# Patient Record
Sex: Female | Born: 1970 | Race: Black or African American | Hispanic: No | Marital: Single | Smoking: Never smoker
Health system: Southern US, Community
[De-identification: ages and names within clinical notes are randomized; demographics above are authoritative.]

## PROBLEM LIST (undated history)

## (undated) DIAGNOSIS — K219 Gastro-esophageal reflux disease without esophagitis: Secondary | ICD-10-CM

## (undated) HISTORY — PX: BREAST ENHANCEMENT SURGERY: SHX7

## (undated) HISTORY — PX: TONSILLECTOMY: SUR1361

## (undated) HISTORY — PX: BREAST LUMPECTOMY: SHX2

## (undated) HISTORY — PX: AUGMENTATION MAMMAPLASTY: SUR837

---

## 2007-08-26 DIAGNOSIS — F411 Generalized anxiety disorder: Secondary | ICD-10-CM | POA: Insufficient documentation

## 2007-08-26 DIAGNOSIS — E559 Vitamin D deficiency, unspecified: Secondary | ICD-10-CM | POA: Insufficient documentation

## 2011-02-14 DIAGNOSIS — K219 Gastro-esophageal reflux disease without esophagitis: Secondary | ICD-10-CM | POA: Insufficient documentation

## 2011-11-06 DIAGNOSIS — M069 Rheumatoid arthritis, unspecified: Secondary | ICD-10-CM | POA: Insufficient documentation

## 2017-11-05 ENCOUNTER — Other Ambulatory Visit: Payer: Self-pay | Admitting: Obstetrics and Gynecology

## 2017-11-05 DIAGNOSIS — Z1231 Encounter for screening mammogram for malignant neoplasm of breast: Secondary | ICD-10-CM

## 2017-11-05 LAB — RESULTS CONSOLE HPV: CHL HPV: NEGATIVE

## 2017-11-05 LAB — OB RESULTS CONSOLE GC/CHLAMYDIA: Chlamydia: NEGATIVE

## 2017-11-05 LAB — HM PAP SMEAR: HM Pap smear: NORMAL

## 2017-11-25 ENCOUNTER — Ambulatory Visit
Admission: RE | Admit: 2017-11-25 | Discharge: 2017-11-25 | Disposition: A | Discharge status: Home / Self Care | Payer: BC Managed Care – PPO | Source: Ambulatory Visit | Attending: Obstetrics and Gynecology | Admitting: Obstetrics and Gynecology

## 2017-11-25 ENCOUNTER — Other Ambulatory Visit: Payer: Self-pay | Admitting: Obstetrics and Gynecology

## 2017-11-25 ENCOUNTER — Encounter: Payer: Self-pay | Admitting: Radiology

## 2017-11-25 DIAGNOSIS — Z1231 Encounter for screening mammogram for malignant neoplasm of breast: Secondary | ICD-10-CM | POA: Diagnosis present

## 2017-12-04 ENCOUNTER — Other Ambulatory Visit: Payer: Self-pay | Admitting: *Deleted

## 2017-12-04 ENCOUNTER — Inpatient Hospital Stay
Admission: RE | Admit: 2017-12-04 | Discharge: 2017-12-04 | Disposition: A | Discharge status: Home / Self Care | Payer: Self-pay | Source: Ambulatory Visit | Attending: *Deleted | Admitting: *Deleted

## 2017-12-04 DIAGNOSIS — Z9289 Personal history of other medical treatment: Secondary | ICD-10-CM

## 2018-09-15 ENCOUNTER — Ambulatory Visit: Payer: Self-pay | Admitting: Family Medicine

## 2018-09-24 ENCOUNTER — Encounter: Payer: Self-pay | Admitting: Family Medicine

## 2018-09-24 ENCOUNTER — Ambulatory Visit: Payer: BC Managed Care – PPO | Admitting: Family Medicine

## 2018-09-24 VITALS — BP 124/78 | HR 100 | Temp 98.2°F | Resp 16 | Ht 62.0 in | Wt 169.7 lb

## 2018-09-24 DIAGNOSIS — Z7689 Persons encountering health services in other specified circumstances: Secondary | ICD-10-CM

## 2018-09-24 DIAGNOSIS — Z6831 Body mass index (BMI) 31.0-31.9, adult: Secondary | ICD-10-CM

## 2018-09-24 DIAGNOSIS — R079 Chest pain, unspecified: Secondary | ICD-10-CM | POA: Diagnosis not present

## 2018-09-24 DIAGNOSIS — K219 Gastro-esophageal reflux disease without esophagitis: Secondary | ICD-10-CM

## 2018-09-24 DIAGNOSIS — Z1159 Encounter for screening for other viral diseases: Secondary | ICD-10-CM

## 2018-09-24 DIAGNOSIS — Z114 Encounter for screening for human immunodeficiency virus [HIV]: Secondary | ICD-10-CM

## 2018-09-24 DIAGNOSIS — Z13220 Encounter for screening for lipoid disorders: Secondary | ICD-10-CM

## 2018-09-24 DIAGNOSIS — Z23 Encounter for immunization: Secondary | ICD-10-CM

## 2018-09-24 DIAGNOSIS — E6609 Other obesity due to excess calories: Secondary | ICD-10-CM

## 2018-09-24 DIAGNOSIS — M069 Rheumatoid arthritis, unspecified: Secondary | ICD-10-CM

## 2018-09-24 MED ORDER — OMEPRAZOLE 20 MG PO CPDR: 20.0000 mg | DELAYED_RELEASE_CAPSULE | Freq: Every day | ORAL | 1 refills | Status: DC

## 2018-09-24 NOTE — Patient Instructions (Signed)

## 2018-09-24 NOTE — Progress Notes (Signed)
Name: Rita Martinez   MRN: [id number]    DOB: 1971/07/09   Date:09/24/2018       Progress Note  Subjective  Chief Complaint  Chief Complaint  Patient presents with  . Establish Care  . Annual Exam    HPI  Pt in to establish care, she has not been seeing a primary care provider in the past, pt also in for evaluation of intermittent chest pain for the last few months, states she went to check her BP during an episode and her BP was elevated. This prompted her to start to establish care.  Chest pain: Pt c/o intermittent chest pain, worse with caffeine intake, pt thinks it is related to her diet. Pain is substernal and she describes this as tightness, sometimes pain is to the left side of her chest. Denies palpitations, denies shortness of breath. Most recent episode of chest pain was last week, states it started after eating BBQ at work. Pt took a goody powder which did improve pain, pain resolved the next day. BP within normal range in office today. Pt has never been treated for HTN in the past. Pt does report intermittent foot and ankle swelling but has not experienced that in the past several months.  Family history - Maternal grandfather has heart disease in his 67's.  She notes history of "stomach issues" many years ago - sulfasalazine seems to help with this now. Denies blood in stool or dark and tarry stool.  RA: followed at [facility], last saw her rheumatologist earlier in the year. Pt currently taking sulfasalazine for this and has been doing well on this. Denies recent flairs, denies joint pain or swelling  There are no active problems to display for this patient.   Past Surgical History:  Procedure Laterality Date  . AUGMENTATION MAMMAPLASTY Bilateral 1990's   saline  . BREAST LUMPECTOMY Left 1990's   benign    Family History  Problem Relation Age of Onset  . Breast cancer Mother 62  . High Cholesterol Mother   . Multiple myeloma Mother   . Prostate cancer Father   . High  Cholesterol Father   . Hypertension Father     Social History   Socioeconomic History  . Marital status: Single    Spouse name: Not on file  . Number of children: 2  . Years of education: Not on file  . Highest education level: Not on file  Occupational History  . Not on file  Social Needs  . Financial resource strain: Not hard at all  . Food insecurity:    Worry: Never true    Inability: Never true  . Transportation needs:    Medical: No    Non-medical: No  Tobacco Use  . Smoking status: Never Smoker  . Smokeless tobacco: Never Used  Substance and Sexual Activity  . Alcohol use: Never    Frequency: Never  . Drug use: Never  . Sexual activity: Yes    Partners: Male    Birth control/protection: Surgical  Lifestyle  . Physical activity:    Days per week: 0 days    Minutes per session: 0 min  . Stress: Not at all  Relationships  . Social connections:    Talks on phone: More than three times a week    Gets together: More than three times a week    Attends religious service: Never    Active member of club or organization: No    Attends meetings of clubs or organizations:  Never    Relationship status: Separated  . Intimate partner violence:    Fear of current or ex partner: No    Emotionally abused: No    Physically abused: No    Forced sexual activity: No  Other Topics Concern  . Not on file  Social History Narrative   Lives with her son Raynelle Jan.      Current Outpatient Medications:  .  sulfaSALAzine (AZULFIDINE) 500 MG tablet, sulfasalazine 500 mg tablet  TAKE 2 TABLETS (1,000 MG TOTAL) BY MOUTH 2 TIMES DAILY., Disp: , Rfl:  .  omeprazole (PRILOSEC) 20 MG capsule, Take 1 capsule (20 mg total) by mouth daily., Disp: 60 capsule, Rfl: 1  No Known Allergies  I personally reviewed active problem list, medication list, allergies with the patient/caregiver today.   ROS Constitutional: Negative for fever or weight change.  Respiratory: Negative for cough  and shortness of breath.   Cardiovascular: See HPI Gastrointestinal: Negative for abdominal pain, no bowel changes.  Musculoskeletal: Negative for gait problem or joint swelling.  Skin: Negative for rash.  Neurological: Negative for dizziness or headache.  No other specific complaints in a complete review of systems (except as listed in HPI above).   Objective  Vitals:   09/24/18 1049  BP: 124/78  Pulse: 100  Resp: 16  Temp: 98.2 F (36.8 C)  TempSrc: Oral  SpO2: 98%  Weight: 169 lb 11.2 oz (77 kg)  Height: _0  (1.575 m)   Body mass index is 31.04 kg/m.  Physical Exam Constitutional: Patient appears well-developed and well-nourished. No distress.  HENT: Head: Normocephalic and atraumatic. Nose: Nose normal. Mouth/Throat: Oropharynx is clear and moist. No oropharyngeal exudate.  Eyes: Conjunctivae and EOM are normal. Pupils are equal, round, and reactive to light. No scleral icterus.  Neck: Normal range of motion. Neck supple. No JVD present. No thyromegaly present.  Cardiovascular: Normal rate, regular rhythm and normal heart sounds.  No murmur heard. No BLE edema. Pulmonary/Chest: Effort normal and breath sounds normal. No respiratory distress. Abdominal: Soft. Bowel sounds are normal, no distension. There is no tenderness. no masses Musculoskeletal: Normal range of motion, no joint effusions. No gross deformities Neurological: he is alert and oriented to person, place, and time. No cranial nerve deficit. Coordination, balance, strength, speech and gait are normal.  Skin: Skin is warm and dry. No rash noted. No erythema.  Psychiatric: Patient has a normal mood and affect. behavior is normal. Judgment and thought content normal.  No results found for this or any previous visit (from the past 72 hour(s)).  PHQ2/9: Depression screen PHQ 2/9 09/24/2018  Decreased Interest 0  Down, Depressed, Hopeless 0  PHQ - 2 Score 0  Altered sleeping 0  Tired, decreased energy 0    Change in appetite 0  Feeling bad or failure about yourself  0  Trouble concentrating 0  Moving slowly or fidgety/restless 0  Suicidal thoughts 0  PHQ-9 Score 0  Difficult doing work/chores Not difficult at all   Fall Risk: Fall Risk  09/24/2018  Falls in the past year? 0  Number falls in past yr: 0  Injury with Fall? 0   Assessment & Plan  1. Chest pain, unspecified type - EKG: normal EKG, normal sinus rhythm. - CBC - COMPLETE METABOLIC PANEL WITH GFR - Lipid Profile - TSH - EKG 12-Lead - omeprazole (PRILOSEC) 20 MG capsule; Take 1 capsule (20 mg total) by mouth daily.  Dispense: 60 capsule; Refill: 1 - Advised likely GERD,  however we will keep close follow up - if not improving with dietary changes and omeprazole, we will refer to cardiology for further evaluation   2. Encounter to establish care  3. Lipid screening - Lipid Profile  4. Need for Tdap vaccination - Tdap vaccine greater than or equal to 7yo IM  5. Class 1 obesity due to excess calories without serious comorbidity with body mass index (BMI) of 31.0 to 31.9 in adult - CBC - COMPLETE METABOLIC PANEL WITH GFR - Lipid Profile - TSH - Discussed importance of 150 minutes of physical activity weekly, eat two servings of fish weekly, eat one serving of tree nuts ( cashews, pistachios, pecans, almonds.Marland Kitchen) every other day, eat 6 servings of fruit/vegetables daily and drink plenty of water and avoid sweet beverages.   6. Need for hepatitis C screening test - Hepatitis C antibody  7. Encounter for screening for HIV - HIV Antibody (routine testing w rflx)  8. Gastro-esophageal reflux disease without esophagitis - Omeprazole per orders; see AVS for teaching  9. Rheumatoid arthritis, involving unspecified site, unspecified rheumatoid factor presence ([place]) - Keep Rheumatology follow up

## 2018-09-25 ENCOUNTER — Other Ambulatory Visit: Payer: Self-pay | Admitting: Emergency Medicine

## 2018-09-25 ENCOUNTER — Other Ambulatory Visit: Payer: Self-pay | Admitting: Family Medicine

## 2018-09-25 DIAGNOSIS — D649 Anemia, unspecified: Secondary | ICD-10-CM

## 2018-09-29 LAB — TSH: TSH: 1.35 mIU/L

## 2018-09-29 LAB — COMPLETE METABOLIC PANEL WITH GFR
AG Ratio: 1.5 (calc) (ref 1.0–2.5)
ALT: 34 U/L — ABNORMAL HIGH (ref 6–29)
AST: 34 U/L (ref 10–35)
Albumin: 4.3 g/dL (ref 3.6–5.1)
Alkaline phosphatase (APISO): 109 U/L (ref 33–115)
BUN: 9 mg/dL (ref 7–25)
CO2: 26 mmol/L (ref 20–32)
Calcium: 9.4 mg/dL (ref 8.6–10.2)
Chloride: 107 mmol/L (ref 98–110)
Creat: 0.96 mg/dL (ref 0.50–1.10)
GFR, Est African American: 82 mL/min/{1.73_m2} (ref >=60)
GFR, Est Non African American: 70 mL/min/{1.73_m2} (ref >=60)
Globulin: 2.8 g/dL (calc) (ref 1.9–3.7)
Glucose, Bld: 75 mg/dL (ref 65–139)
Potassium: 3.9 mmol/L (ref 3.5–5.3)
Sodium: 142 mmol/L (ref 135–146)
Total Bilirubin: 0.5 mg/dL (ref 0.2–1.2)
Total Protein: 7.1 g/dL (ref 6.1–8.1)

## 2018-09-29 LAB — CBC
HCT: 40.3 % (ref 35.0–45.0)
Hemoglobin: 13.1 g/dL (ref 11.7–15.5)
MCH: 25 pg — ABNORMAL LOW (ref 27.0–33.0)
MCHC: 32.5 g/dL (ref 32.0–36.0)
MCV: 76.8 fL — ABNORMAL LOW (ref 80.0–100.0)
MPV: 12 fL (ref 7.5–12.5)
Platelets: 230 10*3/uL (ref 140–400)
RBC: 5.25 10*6/uL — ABNORMAL HIGH (ref 3.80–5.10)
RDW: 14.2 % (ref 11.0–15.0)
WBC: 5.6 10*3/uL (ref 3.8–10.8)

## 2018-09-29 LAB — LIPID PANEL
Cholesterol: 157 mg/dL (ref <200)
HDL: 70 mg/dL (ref >50)
LDL Cholesterol (Calc): 73 mg/dL (calc)
Non-HDL Cholesterol (Calc): 87 mg/dL (calc) (ref <130)
Total CHOL/HDL Ratio: 2.2 (calc) (ref <5.0)
Triglycerides: 59 mg/dL (ref <150)

## 2018-09-29 LAB — HEPATITIS C ANTIBODY
Hepatitis C Ab: NONREACTIVE
SIGNAL TO CUT-OFF: 0.03 (ref <1.00)

## 2018-09-29 LAB — IRON,TIBC AND FERRITIN PANEL
%SAT: 31 % (calc) (ref 16–45)
Ferritin: 52 ng/mL (ref 16–232)
Iron: 107 ug/dL (ref 40–190)
TIBC: 349 mcg/dL (calc) (ref 250–450)

## 2018-09-29 LAB — HIV ANTIBODY (ROUTINE TESTING W REFLEX): HIV 1&2 Ab, 4th Generation: NONREACTIVE

## 2018-10-06 ENCOUNTER — Ambulatory Visit: Payer: BC Managed Care – PPO | Admitting: Family Medicine

## 2018-10-22 ENCOUNTER — Encounter: Payer: Self-pay | Admitting: Family Medicine

## 2018-10-22 ENCOUNTER — Ambulatory Visit (INDEPENDENT_AMBULATORY_CARE_PROVIDER_SITE_OTHER): Payer: BC Managed Care – PPO | Admitting: Family Medicine

## 2018-10-22 VITALS — BP 122/82 | HR 98 | Temp 98.3°F | Resp 16 | Ht 62.0 in | Wt 166.9 lb

## 2018-10-22 DIAGNOSIS — J029 Acute pharyngitis, unspecified: Secondary | ICD-10-CM | POA: Diagnosis not present

## 2018-10-22 DIAGNOSIS — J069 Acute upper respiratory infection, unspecified: Secondary | ICD-10-CM

## 2018-10-22 DIAGNOSIS — L409 Psoriasis, unspecified: Secondary | ICD-10-CM | POA: Insufficient documentation

## 2018-10-22 MED ORDER — FLUTICASONE PROPIONATE 50 MCG/ACT NA SUSP: 2.0000 | Freq: Every day | NASAL | 6 refills | Status: DC

## 2018-10-22 MED ORDER — PREDNISONE 20 MG PO TABS: 20.0000 mg | ORAL_TABLET | Freq: Two times a day (BID) | ORAL | 0 refills | Status: AC

## 2018-10-22 MED ORDER — CLOBETASOL PROPIONATE EMULSION 0.05 % EX FOAM: 1.0000 "application " | Freq: Two times a day (BID) | CUTANEOUS | 0 refills | Status: DC

## 2018-10-22 MED ORDER — MAGIC MOUTHWASH W/LIDOCAINE: 5.0000 mL | Freq: Four times a day (QID) | ORAL | 0 refills | Status: DC | PRN

## 2018-10-22 MED ORDER — TRIAMCINOLONE ACETONIDE 0.1 % EX OINT: 1.0000 "application " | TOPICAL_OINTMENT | Freq: Two times a day (BID) | CUTANEOUS | 0 refills | Status: DC

## 2018-10-22 MED ORDER — BENZONATATE 100 MG PO CAPS: 100.0000 mg | ORAL_CAPSULE | Freq: Three times a day (TID) | ORAL | 0 refills | Status: DC | PRN

## 2018-10-22 NOTE — Progress Notes (Signed)
Name: Rita Martinez   MRN: [id number]    DOB: 12/18/1970   Date:10/22/2018       Progress Note  Subjective  Chief Complaint  Chief Complaint  Patient presents with  . URI    chills, cough, sore throst, headache for 3 days    HPI  PT presents with concern for new onset cough, sore throat, some headache and intermittent chills.  Endorses some frontal sinus congestionb.  No fevers, chest pain, shortness of breath, NVD or abdominal pain.  Rash: She also notes a rash on the scalp that has been present on and off for many years - has cleared with oral prednisone in the past - has never been told she has psoriasis or eczema in the past. The area is itchy but no pain, no exuxdate  Patient Active Problem List   Diagnosis Date Noted  . Chest pain 09/24/2018  . Class 1 obesity due to excess calories without serious comorbidity with body mass index (BMI) of 31.0 to 31.9 in adult 09/24/2018  . Rheumatoid arthritis ([place]) 11/06/2011  . Gastro-esophageal reflux disease without esophagitis 02/14/2011  . Anxiety state 08/26/2007  . Vitamin D deficiency 08/26/2007    Social History   Tobacco Use  . Smoking status: Never Smoker  . Smokeless tobacco: Never Used  Substance Use Topics  . Alcohol use: Never    Frequency: Never     Current Outpatient Medications:  .  omeprazole (PRILOSEC) 20 MG capsule, Take 1 capsule (20 mg total) by mouth daily., Disp: 60 capsule, Rfl: 1 .  sulfaSALAzine (AZULFIDINE) 500 MG tablet, sulfasalazine 500 mg tablet  TAKE 2 TABLETS (1,000 MG TOTAL) BY MOUTH 2 TIMES DAILY., Disp: , Rfl:   No Known Allergies  I personally reviewed active problem list, medication list, allergies with the patient/caregiver today.  ROS  Ten systems reviewed and is negative except as mentioned in HPI  Objective  Vitals:   10/22/18 0942  BP: 122/82  Pulse: 98  Resp: 16  Temp: 98.3 F (36.8 C)  TempSrc: Oral  SpO2: 96%  Weight: 166 lb 14.4 oz (75.7 kg)  Height: 5\' 2"   (1.575 m)   Body mass index is 30.53 kg/m.  Nursing Note and Vital Signs reviewed.  Physical Exam  Constitutional: Patient appears well-developed and well-nourished.  No distress.  HEENT: head atraumatic, normocephalic, pupils equal and reactive to light, Bilateral TM's without erythema or effusion,  bilateral maxillary and frontal sinuses are non-tender, neck supple without lymphadenopathy, OP with erythema but no exudate, no tonsillar swelling. Cardiovascular: Normal rate, regular rhythm and normal heart sounds.  No murmur heard. No BLE edema. Pulmonary/Chest: Effort normal and breath sounds clear bilaterally. No respiratory distress. Abdominal: Soft, bowel sounds normal, there is no tenderness, no HSM Psychiatric: Patient has a normal mood and affect. behavior is normal. Judgment and thought content normal. Skin: Several small plaques that are hyperpigmented and non-erythematous without exudate to scalp and just below hairline are present.  No results found for this or any previous visit (from the past 72 hour(s)).  Assessment & Plan  1. Sore throat - magic mouthwash w/lidocaine SOLN; Take 5 mLs by mouth 4 (four) times daily as needed for mouth pain.  Dispense: 100 mL; Refill: 0 - predniSONE (DELTASONE) 20 MG tablet; Take 1 tablet (20 mg total) by mouth 2 (two) times daily with a meal for 5 days.  Dispense: 10 tablet; Refill: 0  2. Upper respiratory tract infection, unspecified type - benzonatate (TESSALON PERLES) 100  MG capsule; Take 1-2 capsules (100-200 mg total) by mouth 3 (three) times daily as needed.  Dispense: 20 capsule; Refill: 0 - fluticasone (FLONASE) 50 MCG/ACT nasal spray; Place 2 sprays into both nostrils daily.  Dispense: 16 g; Refill: 6 - magic mouthwash w/lidocaine SOLN; Take 5 mLs by mouth 4 (four) times daily as needed for mouth pain.  Dispense: 100 mL; Refill: 0  3. Psoriasis of scalp - triamcinolone ointment (KENALOG) 0.1 %; Apply 1 application topically 2  (two) times daily.  Dispense: 30 g; Refill: 0 - Clobetasol Propionate Emulsion 0.05 % topical foam; Apply 1 application topically 2 (two) times daily. Apply to scalp only, wash hands thoroughly after use.  Dispense: 100 g; Refill: 0  -Red flags and when to present for emergency care or RTC including fever >101.62F, chest pain, shortness of breath, new/worsening/un-resolving symptoms, reviewed with patient at time of visit. Follow up and care instructions discussed and provided in AVS.

## 2018-12-21 ENCOUNTER — Other Ambulatory Visit: Payer: Self-pay | Admitting: Obstetrics and Gynecology

## 2018-12-21 DIAGNOSIS — Z1231 Encounter for screening mammogram for malignant neoplasm of breast: Secondary | ICD-10-CM

## 2019-01-07 ENCOUNTER — Ambulatory Visit
Admission: RE | Admit: 2019-01-07 | Discharge: 2019-01-07 | Disposition: A | Discharge status: Home / Self Care | Payer: BC Managed Care – PPO | Source: Ambulatory Visit | Attending: Obstetrics and Gynecology | Admitting: Obstetrics and Gynecology

## 2019-01-07 DIAGNOSIS — Z1231 Encounter for screening mammogram for malignant neoplasm of breast: Secondary | ICD-10-CM | POA: Insufficient documentation

## 2019-05-07 ENCOUNTER — Telehealth: Payer: BC Managed Care – PPO | Admitting: Family

## 2019-05-07 DIAGNOSIS — M545 Low back pain, unspecified: Secondary | ICD-10-CM

## 2019-05-07 MED ORDER — BACLOFEN 10 MG PO TABS: 10.0000 mg | ORAL_TABLET | Freq: Three times a day (TID) | ORAL | 0 refills | Status: DC | PRN

## 2019-05-07 MED ORDER — ETODOLAC 300 MG PO CAPS: 300.0000 mg | ORAL_CAPSULE | Freq: Two times a day (BID) | ORAL | 0 refills | Status: DC

## 2019-05-07 NOTE — Progress Notes (Signed)
Greater than 5 minutes, yet less than 10 minutes of time have been spent researching, coordinating, and implementing care for this patient today.  Thank you for the details you included in the comment boxes. Those details are very helpful in determining the best course of treatment for you and help [place] to provide the best care.  We are sorry that you are not feeling well.  Here is how we plan to help!  Based on what you have shared with me it looks like you mostly have acute back pain.  Acute back pain is defined as musculoskeletal pain that can resolve in 1-3 weeks with conservative treatment.  I have prescribed Etodolac 300 mg twice a day non-steroid anti-inflammatory (NSAID) as well as Baclofen 10 mg every eight hours as needed which is a muscle relaxer  Some patients experience stomach irritation or in increased heartburn with anti-inflammatory drugs.  Please keep in mind that muscle relaxer's can cause fatigue and should not be taken while at work or driving.  Back pain is very common.  The pain often gets better over time.  The cause of back pain is usually not dangerous.  Most people can learn to manage their back pain on their own.  Home Care  Stay active.  Start with short walks on flat ground if you can.  Try to walk farther each day.  Do not sit, drive or stand in one place for more than 30 minutes.  Do not stay in bed.  Do not avoid exercise or work.  Activity can help your back heal faster.  Be careful when you bend or lift an object.  Bend at your knees, keep the object close to you, and do not twist.  Sleep on a firm mattress.  Lie on your side, and bend your knees.  If you lie on your back, put a pillow under your knees.  Only take medicines as told by your doctor.  Put ice on the injured area.  Put ice in a plastic bag  Place a towel between your skin and the bag  Leave the ice on for 15-20 minutes, 3-4 times a day for the first 2-3 days. 210 After that, you can switch  between ice and heat packs.  Ask your doctor about back exercises or massage.  Avoid feeling anxious or stressed.  Find good ways to deal with stress, such as exercise.  Get Help Right Way If:  Your pain does not go away with rest or medicine.  Your pain does not go away in 1 week.  You have new problems.  You do not feel well.  The pain spreads into your legs.  You cannot control when you poop (bowel movement) or pee (urinate)  You feel sick to your stomach (nauseous) or throw up (vomit)  You have belly (abdominal) pain.  You feel like you may pass out (faint).  If you develop a fever.  Make Sure you:  Understand these instructions.  Will watch your condition  Will get help right away if you are not doing well or get worse.  Your e-visit answers were reviewed by a board certified advanced clinical practitioner to complete your personal care plan.  Depending on the condition, your plan could have included both over the counter or prescription medications.  If there is a problem please reply  once you have received a response from your provider.  Your safety is important to [place].  If you have drug allergies check your prescription  carefully.    You can use MyChart to ask questions about today's visit, request a non-urgent call back, or ask for a work or school excuse for 24 hours related to this e-Visit. If it has been greater than 24 hours you will need to follow up with your provider, or enter a new e-Visit to address those concerns.  You will get an e-mail in the next two days asking about your experience.  I hope that your e-visit has been valuable and will speed your recovery. Thank you for using e-visits.

## 2020-02-02 ENCOUNTER — Encounter: Payer: Self-pay | Admitting: Family Medicine

## 2020-04-27 ENCOUNTER — Other Ambulatory Visit: Payer: Self-pay | Admitting: Obstetrics and Gynecology

## 2020-04-27 DIAGNOSIS — Z1231 Encounter for screening mammogram for malignant neoplasm of breast: Secondary | ICD-10-CM

## 2020-05-09 ENCOUNTER — Ambulatory Visit
Admission: RE | Admit: 2020-05-09 | Discharge: 2020-05-09 | Disposition: A | Discharge status: Home / Self Care | Payer: BC Managed Care – PPO | Source: Ambulatory Visit | Attending: Obstetrics and Gynecology | Admitting: Obstetrics and Gynecology

## 2020-05-09 DIAGNOSIS — Z1231 Encounter for screening mammogram for malignant neoplasm of breast: Secondary | ICD-10-CM | POA: Diagnosis not present

## 2020-07-16 ENCOUNTER — Telehealth: Payer: BC Managed Care – PPO | Admitting: Emergency Medicine

## 2020-07-16 DIAGNOSIS — S90862A Insect bite (nonvenomous), left foot, initial encounter: Secondary | ICD-10-CM | POA: Diagnosis not present

## 2020-07-16 DIAGNOSIS — W57XXXA Bitten or stung by nonvenomous insect and other nonvenomous arthropods, initial encounter: Secondary | ICD-10-CM

## 2020-07-16 NOTE — Progress Notes (Signed)
Time spent: 10 min  Rita Martinez,  Thank you for describing the insect sting for Rita Martinez.  Here is how we plan to help!  An uncomplicated insect sting that just occurred and can be closely followed using the instructions in your care plan.  Take ibuprofen and/or acetaminophen every 6-8 hours for pain, swelling.  Elevate your foot.  Apply ice and wrap with a compressive bandage to help with swelling.  Continue taking 25-50 mg diphenhydramine (benadryl) every 6 hours for itching. Symptoms should improve in 2-3 days.   Seek immediately help if there is any facial, lip, throat or tongue swelling, chest pain or shortness of breath or if you develop symptoms of an infection like sting area redness, warmth, pus, fever.   The 2 greatest risks from insect stings are allergic reaction, which can be fatal in some people and infection, which is more common and less serious.  Bees, wasps, yellow jackets, and hornets belong to a class of insects called Hymenoptera.  Most insect stings cause only minor discomfort.  Stings can happen anywhere on the body and can be painful.  Most stings are from honey bees or yellow jackets.  Fire ants can sting multiple times.  The sites of the stings are more likely to become infected.    Provided a home care guide for insect stings and instructions on when to call for help.  What can be used to prevent Insect Stings?   Insect repellant with at least 20% DEET.    Wearing long pants and shirts with socks and shoes.    Wear dark or drab-colored clothes rather than bright colors.    Avoid using perfumes and hair sprays; these attract insects.  HOME CARE ADVICE:  1. Stinger removal:  The stinger looks like a tiny black dot in the sting.  Use a fingernail, credit card edge, or knife-edge to scrape it off.  Don't pull it out because it squeezes out more venom.  If the stinger is below the skin surface, leave it alone.  It will be shed with normal skin healing. 2. Use  cold compresses to the area of the sting for 10-20 minutes.  You may repeat this as needed to relieve symptoms of pain and swelling. 3.  For pain relief, take acetominophen 650 mg 4-6 hours as needed or ibuprofen 400 mg every 6-8 hours as needed or naproxen 250-500 mg every 12 hours as needed. 4.  You can also use hydrocortisone cream 0.5% or 1% up to 4 times daily as needed for itching. 5.  If the sting becomes very itchy, take Benadryl 25-50 mg, follow directions on box. 6.  Wash the area 2-3 times daily with antibacterial soap and warm water. 7. Call your Doctor if:  Fever, a severe headache, or rash occur in the next 2 weeks.  Sting area begins to look infected.  Redness and swelling worsens after home treatment.  Your current symptoms become worse.    MAKE SURE YOU:   Understand these instructions.  Will watch your condition.  Will get help right away if you are not doing well or get worse.  Thank you for choosing an e-visit. Your e-visit answers were reviewed by a board certified advanced clinical practitioner to complete your personal care plan. Depending upon the condition, your plan could have included both over the counter or prescription medications. Please review your pharmacy choice. Be sure that the pharmacy you have chosen is open so that you can pick up your prescription now.  If there is a problem you may message your provider in [place] to have the prescription routed to another pharmacy. Your safety is important to Rita Martinez. If you have drug allergies check your prescription carefully.  For the next 24 hours, you can use MyChart to ask questions about today's visit, request a non-urgent call back, or ask for a work or school excuse from your e-visit provider. You will get an email in the next two days asking about your experience. I hope that your e-visit has been valuable and will speed your recovery.

## 2020-11-09 ENCOUNTER — Encounter: Payer: BC Managed Care – PPO | Admitting: Family Medicine

## 2020-11-14 ENCOUNTER — Other Ambulatory Visit: Payer: Self-pay

## 2020-11-14 ENCOUNTER — Emergency Department: Payer: BC Managed Care – PPO

## 2020-11-14 ENCOUNTER — Emergency Department
Admission: EM | Admit: 2020-11-14 | Discharge: 2020-11-14 | Disposition: A | Discharge status: Home / Self Care | Payer: BC Managed Care – PPO | Attending: Emergency Medicine | Admitting: Emergency Medicine

## 2020-11-14 ENCOUNTER — Encounter: Payer: Self-pay | Admitting: Emergency Medicine

## 2020-11-14 DIAGNOSIS — R110 Nausea: Secondary | ICD-10-CM | POA: Diagnosis not present

## 2020-11-14 DIAGNOSIS — R0789 Other chest pain: Secondary | ICD-10-CM | POA: Diagnosis not present

## 2020-11-14 DIAGNOSIS — R079 Chest pain, unspecified: Secondary | ICD-10-CM

## 2020-11-14 LAB — COMPREHENSIVE METABOLIC PANEL
ALT: 34 U/L (ref 0–44)
AST: 32 U/L (ref 15–41)
Albumin: 3.9 g/dL (ref 3.5–5.0)
Alkaline Phosphatase: 89 U/L (ref 38–126)
Anion gap: 10 (ref 5–15)
BUN: 10 mg/dL (ref 6–20)
CO2: 27 mmol/L (ref 22–32)
Calcium: 9.4 mg/dL (ref 8.9–10.3)
Chloride: 103 mmol/L (ref 98–111)
Creatinine, Ser: 0.83 mg/dL (ref 0.44–1.00)
GFR, Estimated: >60 mL/min (ref >60)
Glucose, Bld: 95 mg/dL (ref 70–99)
Potassium: 3.6 mmol/L (ref 3.5–5.1)
Sodium: 140 mmol/L (ref 135–145)
Total Bilirubin: 0.6 mg/dL (ref 0.3–1.2)
Total Protein: 7.2 g/dL (ref 6.5–8.1)

## 2020-11-14 LAB — CBC WITH DIFFERENTIAL/PLATELET
Abs Immature Granulocytes: 0.02 10*3/uL (ref 0.00–0.07)
Basophils Absolute: 0 10*3/uL (ref 0.0–0.1)
Basophils Relative: 0 %
Eosinophils Absolute: 0 10*3/uL (ref 0.0–0.5)
Eosinophils Relative: 0 %
HCT: 38.2 % (ref 36.0–46.0)
Hemoglobin: 12.3 g/dL (ref 12.0–15.0)
Immature Granulocytes: 0 %
Lymphocytes Relative: 38 %
Lymphs Abs: 2.5 10*3/uL (ref 0.7–4.0)
MCH: 24.5 pg — ABNORMAL LOW (ref 26.0–34.0)
MCHC: 32.2 g/dL (ref 30.0–36.0)
MCV: 75.9 fL — ABNORMAL LOW (ref 80.0–100.0)
Monocytes Absolute: 0.5 10*3/uL (ref 0.1–1.0)
Monocytes Relative: 7 %
Neutro Abs: 3.7 10*3/uL (ref 1.7–7.7)
Neutrophils Relative %: 55 %
Platelets: 200 10*3/uL (ref 150–400)
RBC: 5.03 MIL/uL (ref 3.87–5.11)
RDW: 14.4 % (ref 11.5–15.5)
WBC: 6.7 10*3/uL (ref 4.0–10.5)
nRBC: 0 % (ref 0.0–0.2)

## 2020-11-14 LAB — TROPONIN I (HIGH SENSITIVITY)
Troponin I (High Sensitivity): <2 ng/L (ref <18)
Troponin I (High Sensitivity): <2 ng/L (ref <18)

## 2020-11-14 MED ORDER — OMEPRAZOLE 20 MG PO CPDR: 20.0000 mg | DELAYED_RELEASE_CAPSULE | Freq: Every day | ORAL | 0 refills | Status: DC

## 2020-11-14 NOTE — ED Provider Notes (Signed)
[facility] Emergency Department Provider Note  ____________________________________________   Event Date/Time   First MD Initiated Contact with Patient 11/14/20 [phone]     (approximate)  I have reviewed the triage vital signs and the nursing notes.   HISTORY  Chief Complaint Chest Pain   HPI Rita Martinez is a 50 y.o. female with a past medical history of psoriasis, RA currently on sulfasalazine, vitamin D deficiency, anxiety, and GERD who presents for assessment of some substernal chest tightness she experienced earlier this morning.  It was accompanied by some nausea but no vomiting.  Patient states she currently feels much better than when it began.  She denies any other acute symptoms including fevers, chills, cough, shortness of breath, abdominal pain, back pain, headache, earache, sore throat, diarrhea, dysuria, rash, extremity pain or any other acute sick symptoms.  She notes she has been clearing her throat more often than usual the last several weeks.  She denies tobacco abuse or significant NSAID use.  Denies any regular EtOH use.  No history of DVT/PE.  No recent surgeries in the past couple months.  No recent mobilization.         History reviewed. No pertinent past medical history.  Patient Active Problem List   Diagnosis Date Noted  . 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 Chest pain 09/24/2018  . Class 1 obesity due to excess calories without serious comorbidity with body mass index (BMI) of 31.0 to 31.9 in adult 09/24/2018  . Rheumatoid arthritis ([place]) 11/06/2011  . Gastro-esophageal reflux disease without esophagitis 02/14/2011  . Anxiety state 08/26/2007  . Vitamin D deficiency 08/26/2007    Past Surgical History:  Procedure Laterality Date  . AUGMENTATION MAMMAPLASTY Bilateral 1990's   saline  . BREAST LUMPECTOMY Left 1990's   benign    Prior to Admission medications   Medication Sig Start Date End Date Taking? Authorizing Provider   baclofen (LIORESAL) 10 MG tablet Take 1 tablet (10 mg total) by mouth every 8 (eight) hours as needed for muscle spasms. 05/07/19   Withrow, Elyse Jarvis, FNP  benzonatate (TESSALON PERLES) 100 MG capsule Take 1-2 capsules (100-200 mg total) by mouth 3 (three) times daily as needed. 10/22/18   Hubbard Hartshorn, FNP  Clobetasol Propionate Emulsion 0.05 % topical foam Apply 1 application topically 2 (two) times daily. Apply to scalp only, wash hands thoroughly after use. 10/22/18   Hubbard Hartshorn, FNP  etodolac (LODINE) 300 MG capsule Take 1 capsule (300 mg total) by mouth 2 (two) times daily. 05/07/19   Withrow, Elyse Jarvis, FNP  fluticasone (FLONASE) 50 MCG/ACT nasal spray Place 2 sprays into both nostrils daily. 10/22/18   Hubbard Hartshorn, FNP  magic mouthwash w/lidocaine SOLN Take 5 mLs by mouth 4 (four) times daily as needed for mouth pain. 10/22/18   Hubbard Hartshorn, FNP  omeprazole (PRILOSEC) 20 MG capsule Take 1 capsule (20 mg total) by mouth daily. 11/14/20 12/14/20  Lucrezia Starch, MD  sulfaSALAzine (AZULFIDINE) 500 MG tablet sulfasalazine 500 mg tablet  TAKE 2 TABLETS (1,000 MG TOTAL) BY MOUTH 2 TIMES DAILY. 05/01/16   [provider]  triamcinolone ointment (KENALOG) 0.1 % Apply 1 application topically 2 (two) times daily. 10/22/18   Hubbard Hartshorn, FNP    Allergies Patient has no known allergies.  Family History  Problem Relation Age of Onset  . Breast cancer Mother 26  . High Cholesterol Mother   . Multiple myeloma Mother   . Prostate  cancer Father   . High Cholesterol Father   . Hypertension Father     Social History Social History   Tobacco Use  . Smoking status: Never Smoker  . Smokeless tobacco: Never Used  Vaping Use  . Vaping Use: Never used  Substance Use Topics  . Alcohol use: Never  . Drug use: Never    Review of Systems  Review of Systems  Constitutional: Negative for chills and fever.  HENT: Negative for sore throat.   Eyes: Negative for pain.  Respiratory:  Negative for cough and stridor.   Cardiovascular: Positive for chest pain.  Gastrointestinal: Negative for vomiting.  Genitourinary: Negative for dysuria.  Musculoskeletal: Negative for myalgias.  Skin: Negative for rash.  Neurological: Negative for seizures, loss of consciousness and headaches.  Psychiatric/Behavioral: Negative for suicidal ideas.  All other systems reviewed and are negative.     ____________________________________________   PHYSICAL EXAM:  VITAL SIGNS: ED Triage Vitals  Enc Vitals Group     BP 11/14/20 0347 128/79     Pulse Rate 11/14/20 0347 69     Resp 11/14/20 0347 18     Temp 11/14/20 0347 98.2 F (36.8 C)     Temp Source 11/14/20 0347 Oral     SpO2 11/14/20 0347 98 %     Weight 11/14/20 0346 165 lb (74.8 kg)     Height 11/14/20 0346 5' 2" (1.575 m)     Head Circumference --      Peak Flow --      Pain Score 11/14/20 0346 4     Pain Loc --      Pain Edu? --      Excl. in [place]? --    Vitals:   11/14/20 0347 11/14/20 0653  BP: 128/79 126/81  Pulse: 69 (!) 59  Resp: 18 20  Temp: 98.2 F (36.8 C) 98 F (36.7 C)  SpO2: 98% 100%   Physical Exam Vitals and nursing note reviewed.  Constitutional:      General: She is not in acute distress.    Appearance: She is well-developed and well-nourished.  HENT:     Head: Normocephalic and atraumatic.     Right Ear: External ear normal.     Left Ear: External ear normal.     Nose: Nose normal.     Mouth/Throat:     Mouth: Mucous membranes are moist.  Eyes:     Conjunctiva/sclera: Conjunctivae normal.  Cardiovascular:     Rate and Rhythm: Normal rate and regular rhythm.     Heart sounds: No murmur heard.   Pulmonary:     Effort: Pulmonary effort is normal. No respiratory distress.     Breath sounds: Normal breath sounds.  Abdominal:     Palpations: Abdomen is soft.     Tenderness: There is no abdominal tenderness.  Musculoskeletal:        General: No edema.     Cervical back: Neck supple.      Right lower leg: No edema.     Left lower leg: No edema.  Skin:    General: Skin is warm and dry.     Capillary Refill: Capillary refill takes less than 2 seconds.  Neurological:     Mental Status: She is alert and oriented to person, place, and time.  Psychiatric:        Mood and Affect: Mood and affect and mood normal.      ____________________________________________   LABS (all labs ordered are listed, but  only abnormal results are displayed)  Labs Reviewed  CBC WITH DIFFERENTIAL/PLATELET - Abnormal; Notable for the following components:      Result Value   MCV 75.9 (*)    MCH 24.5 (*)    All other components within normal limits  COMPREHENSIVE METABOLIC PANEL  TROPONIN I (HIGH SENSITIVITY)  TROPONIN I (HIGH SENSITIVITY)   ____________________________________________  EKG  Sinus rhythm with ventricular rate of 68, normal axis, unremarkable intervals, and some artifact in V1 and V2 without any clear evidence of acute ischemia or other significant Arrhythmia.  ____________________________________________  RADIOLOGY  ED MD interpretation: No pneumothorax, focal consolidation, effusion, edema, early acute intrathoracic process.   Official radiology report(s): DG Chest 2 View  Result Date: 11/14/2020 CLINICAL DATA:  Chest pain EXAM: CHEST - 2 VIEW COMPARISON:  None. FINDINGS: No consolidation, features of edema, pneumothorax, or effusion. Pulmonary vascularity is normally distributed. The cardiomediastinal contours are unremarkable. No acute osseous or soft tissue abnormality. IMPRESSION: No acute cardiopulmonary abnormality. Electronically Signed   By: Lovena Le M.D.   On: 11/14/2020 04:01    ____________________________________________   PROCEDURES  Procedure(s) performed (including Critical Care):  Procedures   ____________________________________________   INITIAL IMPRESSION / ASSESSMENT AND PLAN / ED COURSE      Patient presents with above-stated  history exam for assessment of some chest tightness associate with nausea she experienced earlier this morning.  She is afebrile and hemodynamically stable on arrival  Primary differential includes but is not limited to ACS, PE, pneumonia, pneumothorax, dissection, GERD/esophagitis, esophageal spasm and chest wall etiologies.  CBC is unremarkable.  CMP shows no significant electrolyte or metabolic derangements.  No evidence of cholestasis or hepatitis.  Troponins x2 are unremarkable.  Overall patient's history, exam, ECG and chest x-ray are not consistent with pneumonia, pneumothorax or dissection.  Low suspicion for PE as patient is PERC negative.  Low suspicion for ACS given reassuring EKG and 2 nonelevated troponins.  Presentation and work-up is not consistent with myocarditis or pericarditis.  Concern for possible esophageal spasm versus reflux.  Patient has not been taking her omeprazole and her history of clearing her throat is consistent with some reflux seems to be subacute to chronic.  I did refill her omeprazole prescription.  Given otherwise stable vital signs and reassuring work-up I do she is safe for discharge with plan for outpatient follow-up.  Discharged stable condition.  Strict turn precautions advised and discussed.         ____________________________________________   FINAL CLINICAL IMPRESSION(S) / ED DIAGNOSES  Final diagnoses:  Chest pain, unspecified type    Medications - No data to display   ED Discharge Orders         Ordered    omeprazole (PRILOSEC) 20 MG capsule  Daily        11/14/20 0752           Note:  This document was prepared using Dragon voice recognition software and may include unintentional dictation errors.   Lucrezia Starch, MD 11/14/20 [phone]

## 2020-11-14 NOTE — ED Triage Notes (Signed)
Patient ambulatory to triage with steady gait, without difficulty or distress noted; pt reports awoke PTA with mid CP radiating into neck accomp by nausea; denies hx of same

## 2020-11-23 ENCOUNTER — Encounter: Payer: BC Managed Care – PPO | Admitting: Family Medicine

## 2020-12-10 ENCOUNTER — Other Ambulatory Visit: Payer: Self-pay | Admitting: Family Medicine

## 2020-12-10 DIAGNOSIS — R079 Chest pain, unspecified: Secondary | ICD-10-CM

## 2020-12-19 ENCOUNTER — Encounter: Payer: Self-pay | Admitting: Family Medicine

## 2021-01-01 ENCOUNTER — Ambulatory Visit (INDEPENDENT_AMBULATORY_CARE_PROVIDER_SITE_OTHER): Payer: BC Managed Care – PPO | Admitting: Family Medicine

## 2021-01-01 ENCOUNTER — Encounter: Payer: Self-pay | Admitting: Family Medicine

## 2021-01-01 ENCOUNTER — Other Ambulatory Visit: Payer: Self-pay

## 2021-01-01 VITALS — BP 122/74 | HR 100 | Temp 98.3°F | Resp 16 | Ht 62.0 in | Wt 168.0 lb

## 2021-01-01 DIAGNOSIS — E66811 Obesity, class 1: Secondary | ICD-10-CM

## 2021-01-01 DIAGNOSIS — Z0000 Encounter for general adult medical examination without abnormal findings: Secondary | ICD-10-CM

## 2021-01-01 DIAGNOSIS — K219 Gastro-esophageal reflux disease without esophagitis: Secondary | ICD-10-CM

## 2021-01-01 DIAGNOSIS — N951 Menopausal and female climacteric states: Secondary | ICD-10-CM | POA: Diagnosis not present

## 2021-01-01 DIAGNOSIS — N63 Unspecified lump in unspecified breast: Secondary | ICD-10-CM

## 2021-01-01 DIAGNOSIS — M069 Rheumatoid arthritis, unspecified: Secondary | ICD-10-CM

## 2021-01-01 DIAGNOSIS — L409 Psoriasis, unspecified: Secondary | ICD-10-CM | POA: Diagnosis not present

## 2021-01-01 DIAGNOSIS — Z131 Encounter for screening for diabetes mellitus: Secondary | ICD-10-CM

## 2021-01-01 DIAGNOSIS — E669 Obesity, unspecified: Secondary | ICD-10-CM

## 2021-01-01 DIAGNOSIS — Z1211 Encounter for screening for malignant neoplasm of colon: Secondary | ICD-10-CM | POA: Diagnosis not present

## 2021-01-01 DIAGNOSIS — Z13220 Encounter for screening for lipoid disorders: Secondary | ICD-10-CM

## 2021-01-01 DIAGNOSIS — Z8349 Family history of other endocrine, nutritional and metabolic diseases: Secondary | ICD-10-CM

## 2021-01-01 DIAGNOSIS — Z809 Family history of malignant neoplasm, unspecified: Secondary | ICD-10-CM

## 2021-01-01 MED ORDER — TRIAMCINOLONE ACETONIDE 0.1 % EX OINT: 1.0000 "application " | TOPICAL_OINTMENT | Freq: Two times a day (BID) | CUTANEOUS | 0 refills | Status: DC

## 2021-01-01 MED ORDER — ESOMEPRAZOLE MAGNESIUM 40 MG PO CPDR: 40.0000 mg | DELAYED_RELEASE_CAPSULE | Freq: Every day | ORAL | 1 refills | Status: DC

## 2021-01-01 MED ORDER — CLOBETASOL PROPIONATE EMULSION 0.05 % EX FOAM: 1.0000 "application " | Freq: Two times a day (BID) | CUTANEOUS | 0 refills | Status: DC

## 2021-01-01 NOTE — Progress Notes (Addendum)
Name: Rita Martinez   MRN: [id number]    DOB: 07/19/1971   Date:01/01/2021       Progress Note  Subjective  Chief Complaint  Annual Exam   HPI  Patient presents for annual CPE, discussed may have extra charge for split  visit, but she agreed   RA: she lost to follow up with her Rheumatologist because of location. She is still compliant with medication. She still has morning stiffness takes about 1 hour to feels better. No redness or increase in warmth . She has mild aching pain but not constant  Menopausal symptoms: she was on estrogen and paxil, but last year dose of paxil was increased from 10 mg to 20 mg and vasomotor symptoms has improved . She is also under stress, her mother was diagnosed with Multiple Myeloma and will start treatment soon. She is feeling stressed but able to cope  Scalp rash: plaque on nuchal area, improves with topical steroids and needs a refill  GERD: long history, went to [facility] because she woke up with chest pain, labs reviewed , she was sent home on Omeprazole but did not work, she is now on Nexium otc and seems to help, no heartburn or indigestion, occasionally has chest discomfort but not associated with diaphoresis or sob  Obesity: BMI has been stable over the past two years, discussed importance of healthy eating and exercise   Exercise: needs to increase to 150 minutes per week .  Flowsheet Row Office Visit from 01/01/2021 in [facility]  AUDIT-C Score 0     Depression: Phq 9 is  negative Depression screen PHQ 2/9 01/01/2021 10/22/2018 09/24/2018  Decreased Interest 0 0 0  Down, Depressed, Hopeless 0 0 0  PHQ - 2 Score 0 0 0  Altered sleeping 0 0 0  Tired, decreased energy 0 0 0  Change in appetite 0 0 0  Feeling bad or failure about yourself  0 0 0  Trouble concentrating 0 0 0  Moving slowly or fidgety/restless 0 0 0  Suicidal thoughts 0 0 0  PHQ-9 Score 0 0 0  Difficult doing work/chores - Not difficult at all Not difficult at  all   Hypertension: BP Readings from Last 3 Encounters:  01/01/21 122/74  11/14/20 135/87  10/22/18 122/82   Obesity: Wt Readings from Last 3 Encounters:  01/01/21 168 lb (76.2 kg)  11/14/20 165 lb (74.8 kg)  10/22/18 166 lb 14.4 oz (75.7 kg)   BMI Readings from Last 3 Encounters:  01/01/21 30.73 kg/m  11/14/20 30.18 kg/m  10/22/18 30.53 kg/m     Vaccines:   Pneumonia: she is due for it, but just received Moderma and we will hold off  Flu: up to date   Hep C Screening: 09/24/2018 STD testing and prevention (HIV/chl/gon/syphilis): 09/24/2018 Intimate partner violence:negative screen  Sexual History : same sexual partner for the past few years  Menstrual History/LMP/Abnormal Bleeding: s/p hysterectomy  Incontinence Symptoms: no problems  Breast cancer:  - Last Mammogram: 05/2020 - BRCA gene screening: mother had breast cancer in her 60's, maternal grandmother history of colon cancer in her 60's - she would like to talk to geneticist about it   Osteoporosis: Discussed high calcium and vitamin D supplementation, weight bearing exercises  Cervical cancer screening: she has follow up with gyn in the Summer for repeat pap smear   Skin cancer: Discussed monitoring for atypical lesions  Colorectal cancer: Ordered at today's visit   Lung cancer:   Low Dose   CT Chest recommended if Age 50-80 years, 20 pack-year currently smoking OR have quit w/in 15years. Patient does not qualify.   ECG: 11/14/2020  Advanced Care Planning: A voluntary discussion about advance care planning including the explanation and discussion of advance directives.  Discussed health care proxy and Living will, and the patient was able to identify a health care proxy as daughter - Tylyn    Lipids: Lab Results  Component Value Date   CHOL 157 09/24/2018   Lab Results  Component Value Date   HDL 70 09/24/2018   Lab Results  Component Value Date   LDLCALC 73 09/24/2018   Lab Results  Component  Value Date   TRIG 59 09/24/2018   Lab Results  Component Value Date   CHOLHDL 2.2 09/24/2018   No results found for: LDLDIRECT  Glucose: Glucose, Bld  Date Value Ref Range Status  11/14/2020 95 70 - 99 mg/dL Final    Comment:    Glucose reference range applies only to samples taken after fasting for at least 8 hours.  09/24/2018 75 65 - 139 mg/dL Final    Comment:    .        Non-fasting reference interval .     Patient Active Problem List   Diagnosis Date Noted  . 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 Chest pain 09/24/2018  . Class 1 obesity due to excess calories without serious comorbidity with body mass index (BMI) of 31.0 to 31.9 in adult 09/24/2018  . Rheumatoid arthritis (HCC) 11/06/2011  . Gastro-esophageal reflux disease without esophagitis 02/14/2011  . Anxiety state 08/26/2007  . Vitamin D deficiency 08/26/2007    Past Surgical History:  Procedure Laterality Date  . AUGMENTATION MAMMAPLASTY Bilateral 1990's   saline  . BREAST LUMPECTOMY Left 1990's   benign    Family History  Problem Relation Age of Onset  . Breast cancer Mother 65  . High Cholesterol Mother   . Multiple myeloma Mother   . Prostate cancer Father   . High Cholesterol Father   . Hypertension Father     Social History   Socioeconomic History  . Marital status: Single    Spouse name: Not on file  . Number of children: 2  . Years of education: Not on file  . Highest education level: Not on file  Occupational History  . Not on file  Tobacco Use  . Smoking status: Never Smoker  . Smokeless tobacco: Never Used  Vaping Use  . Vaping Use: Never used  Substance and Sexual Activity  . Alcohol use: Never  . Drug use: Never  . Sexual activity: Yes    Partners: Male    Birth control/protection: Surgical  Other Topics Concern  . Not on file  Social History Narrative   Lives with her son 21yo - Marlon.    Social Determinants of Health   Financial Resource Strain: Low Risk   .  Difficulty of Paying Living Expenses: Not hard at all  Food Insecurity: No Food Insecurity  . Worried About Running Out of Food in the Last Year: Never true  . Ran Out of Food in the Last Year: Never true  Transportation Needs: No Transportation Needs  . Lack of Transportation (Medical): No  . Lack of Transportation (Non-Medical): No  Physical Activity: Inactive  . Days of Exercise per Week: 0 days  . Minutes of Exercise per Session: 0 min  Stress: No Stress Concern Present  . Feeling of Stress : Not at all    Social Connections: Socially Isolated  . Frequency of Communication with Friends and Family: More than three times a week  . Frequency of Social Gatherings with Friends and Family: Once a week  . Attends Religious Services: Never  . Active Member of Clubs or Organizations: No  . Attends Club or Organization Meetings: Never  . Marital Status: Divorced  Intimate Partner Violence: Not At Risk  . Fear of Current or Ex-Partner: No  . Emotionally Abused: No  . Physically Abused: No  . Sexually Abused: No     Current Outpatient Medications:  .  esomeprazole (NEXIUM) 40 MG capsule, Take 1 capsule (40 mg total) by mouth daily., Disp: 90 capsule, Rfl: 1 .  PARoxetine (PAXIL) 20 MG tablet, Take 20 mg by mouth daily., Disp: , Rfl:  .  sulfaSALAzine (AZULFIDINE) 500 MG tablet, Take 1 tablet by mouth daily., Disp: , Rfl:  .  Clobetasol Propionate Emulsion 0.05 % topical foam, Apply 1 application topically 2 (two) times daily. Apply to scalp only, wash hands thoroughly after use., Disp: 100 g, Rfl: 0 .  triamcinolone ointment (KENALOG) 0.1 %, Apply 1 application topically 2 (two) times daily., Disp: 80 g, Rfl: 0  No Known Allergies   ROS  Constitutional: Negative for fever or weight change.  Respiratory: Negative for cough and shortness of breath.   Cardiovascular: Negative for chest pain or palpitations.  Gastrointestinal: Negative for abdominal pain, no bowel changes.   Musculoskeletal: Negative for gait problem or joint swelling.  Skin: Negative for rash.  Neurological: Negative for dizziness or headache.  No other specific complaints in a complete review of systems (except as listed in HPI above).  Objective  Vitals:   01/01/21 1611  BP: 122/74  Pulse: 100  Resp: 16  Temp: 98.3 F (36.8 C)  TempSrc: Oral  SpO2: 99%  Weight: 168 lb (76.2 kg)  Height: 5' 2" (1.575 m)    Body mass index is 30.73 kg/m.  Physical Exam  Constitutional: Patient appears well-developed and well-nourished. Obesity . No distress.  HENT: Head: Normocephalic and atraumatic. Ears: B TMs ok, no erythema or effusion; Nose: not done Mouth/Throat: not done  Eyes: Conjunctivae and EOM are normal. Pupils are equal, round, and reactive to light. No scleral icterus.  Neck: Normal range of motion. Neck supple. No JVD present. No thyromegaly present.  Cardiovascular: Normal rate, regular rhythm and normal heart sounds.  No murmur heard. No BLE edema. Pulmonary/Chest: Effort normal and breath sounds normal. No respiratory distress. Abdominal: Soft. Bowel sounds are normal, no distension. There is no tenderness. no masses Breast: scar from breast augmentation, scar from previous biopsy on left upper outer quadrant , also has round mass above both nipples and on left upper inner quadrant.  no nipple discharge or rashes FEMALE GENITALIA:  Not done  RECTAL: not done  Musculoskeletal: Normal range of motion, no joint effusions. No gross deformities Neurological: he is alert and oriented to person, place, and time. No cranial nerve deficit. Coordination, balance, strength, speech and gait are normal.  Skin: Skin is warm and dry. No rash noted. No erythema. round soft cyst like structure under scalp in three different areas, non tender, not attached to deeper tissue. Also has a dry patch, quarter size on right nuchal area. Scaly  Psychiatric: Patient has a normal mood and affect.  behavior is normal. Judgment and thought content normal.  Recent Results (from the past 2160 hour(s))  CBC with Differential     Status: Abnormal     Collection Time: 11/14/20  3:50 AM  Result Value Ref Range   WBC 6.7 4.0 - 10.5 K/uL   RBC 5.03 3.87 - 5.11 MIL/uL   Hemoglobin 12.3 12.0 - 15.0 g/dL   HCT 38.2 36.0 - 46.0 %   MCV 75.9 (L) 80.0 - 100.0 fL   MCH 24.5 (L) 26.0 - 34.0 pg   MCHC 32.2 30.0 - 36.0 g/dL   RDW 14.4 11.5 - 15.5 %   Platelets 200 150 - 400 K/uL   nRBC 0.0 0.0 - 0.2 %   Neutrophils Relative % 55 %   Neutro Abs 3.7 1.7 - 7.7 K/uL   Lymphocytes Relative 38 %   Lymphs Abs 2.5 0.7 - 4.0 K/uL   Monocytes Relative 7 %   Monocytes Absolute 0.5 0.1 - 1.0 K/uL   Eosinophils Relative 0 %   Eosinophils Absolute 0.0 0.0 - 0.5 K/uL   Basophils Relative 0 %   Basophils Absolute 0.0 0.0 - 0.1 K/uL   Immature Granulocytes 0 %   Abs Immature Granulocytes 0.02 0.00 - 0.07 K/uL    Comment: Performed at [facility], [address] [place], [place] [address]  Comprehensive metabolic panel     Status: None   Collection Time: 11/14/20  3:50 AM  Result Value Ref Range   Sodium 140 135 - 145 mmol/L   Potassium 3.6 3.5 - 5.1 mmol/L   Chloride 103 98 - 111 mmol/L   CO2 27 22 - 32 mmol/L   Glucose, Bld 95 70 - 99 mg/dL    Comment: Glucose reference range applies only to samples taken after fasting for at least 8 hours.   BUN 10 6 - 20 mg/dL   Creatinine, Ser 0.83 0.44 - 1.00 mg/dL   Calcium 9.4 8.9 - 10.3 mg/dL   Total Protein 7.2 6.5 - 8.1 g/dL   Albumin 3.9 3.5 - 5.0 g/dL   AST 32 15 - 41 U/L   ALT 34 0 - 44 U/L   Alkaline Phosphatase 89 38 - 126 U/L   Total Bilirubin 0.6 0.3 - 1.2 mg/dL   GFR, Estimated >60 >60 mL/min    Comment: (NOTE) Calculated using the CKD-EPI Creatinine Equation (2021)    Anion gap 10 5 - 15    Comment: Performed at [facility], [address] [place], [place] [address]  Troponin I (High Sensitivity)     Status: None    Collection Time: 11/14/20  3:50 AM  Result Value Ref Range   Troponin I (High Sensitivity) <2 <18 ng/L    Comment: (NOTE) Elevated high sensitivity troponin I (hsTnI) values and significant  changes across serial measurements may suggest ACS but many other  chronic and acute conditions are known to elevate hsTnI results.  Refer to the Links section for chest pain algorithms and additional  guidance. Performed at [facility], [address] [place], [place] [address]   Troponin I (High Sensitivity)     Status: None   Collection Time: 11/14/20  6:34 AM  Result Value Ref Range   Troponin I (High Sensitivity) <2 <18 ng/L    Comment: (NOTE) Elevated high sensitivity troponin I (hsTnI) values and significant  changes across serial measurements may suggest ACS but many other  chronic and acute conditions are known to elevate hsTnI results.  Refer to the "Links" section for chest pain algorithms and additional  guidance. Performed at [facility], [address] [place], [place] [address]        Fall Risk: Fall Risk  01/01/2021 10/22/2018 09/24/2018  Falls in the past year? 0 0 0  Number falls in past yr: 0 0 0  Injury with Fall? 0 0 0     Functional Status Survey: Is the patient deaf or have difficulty hearing?: No Does the patient have difficulty seeing, even when wearing glasses/contacts?: No Does the patient have difficulty concentrating, remembering, or making decisions?: No Does the patient have difficulty walking or climbing stairs?: Yes Does the patient have difficulty dressing or bathing?: No Does the patient have difficulty doing errands alone such as visiting a doctor's office or shopping?: No   Assessment & Plan  1. Rheumatoid arthritis, involving unspecified site, unspecified whether rheumatoid factor present [facility])  - Ambulatory referral to Rheumatology  2. Colon cancer screening  - Ambulatory referral to Gastroenterology  3. Psoriasis  of scalp  - triamcinolone ointment (KENALOG) 0.1 %; Apply 1 application topically 2 (two) times daily.  Dispense: 80 g; Refill: 0 - Clobetasol Propionate Emulsion 0.05 % topical foam; Apply 1 application topically 2 (two) times daily. Apply to scalp only, wash hands thoroughly after use.  Dispense: 100 g; Refill: 0  4. Vasomotor symptoms due to menopause   5. Obesity (BMI 30.0-34.9)  Discussed with the patient the risk posed by an increased BMI. Discussed importance of portion control, calorie counting and at least 150 minutes of physical activity weekly. Avoid sweet beverages and drink more water. Eat at least 6 servings of fruit and vegetables daily   6. GERD without esophagitis  - esomeprazole (NEXIUM) 40 MG capsule; Take 1 capsule (40 mg total) by mouth daily.  Dispense: 90 capsule; Refill: 1  7. Well adult exam   8. Family history of thyroid disease  She would like to have TSH done   9. Family history of cancer  - Ambulatory referral to Genetics   10. Lipid screening  - Lipid panel  11. Diabetes mellitus screening  - Hemoglobin A1c  12. Breast lump in female  - MM Digital Diagnostic Bilat; Future - US BREAST LTD UNI LEFT INC AXILLA; Future - US BREAST LTD UNI RIGHT INC AXILLA; Future  -USPSTF grade A and B recommendations reviewed with patient; age-appropriate recommendations, preventive care, screening tests, etc discussed and encouraged; healthy living encouraged; see AVS for patient education given to patient -Discussed importance of 150 minutes of physical activity weekly, eat two servings of fish weekly, eat one serving of tree nuts ( cashews, pistachios, pecans, almonds.Marland Kitchen) every other day, eat 6 servings of fruit/vegetables daily and drink plenty of water and avoid sweet beverages.

## 2021-01-01 NOTE — Addendum Note (Signed)
Addended by: Loistine Chance on: 01/01/2021 05:21 PM   Modules accepted: Orders

## 2021-01-01 NOTE — Patient Instructions (Signed)
Preventive Care 84-50 Years Old, Female Preventive care refers to lifestyle choices and visits with your health care provider that can promote health and wellness. This includes:  A yearly physical exam. This is also called an annual wellness visit.  Regular dental and eye exams.  Immunizations.  Screening for certain conditions.  Healthy lifestyle choices, such as: ? Eating a healthy diet. ? Getting regular exercise. ? Not using drugs or products that contain nicotine and tobacco. ? Limiting alcohol use. What can I expect for my preventive care visit? Physical exam Your health care provider will check your:  Height and weight. These may be used to calculate your BMI (body mass index). BMI is a measurement that tells if you are at a healthy weight.  Heart rate and blood pressure.  Body temperature.  Skin for abnormal spots. Counseling Your health care provider may ask you questions about your:  Past medical problems.  Family's medical history.  Alcohol, tobacco, and drug use.  Emotional well-being.  Home life and relationship well-being.  Sexual activity.  Diet, exercise, and sleep habits.  Work and work Statistician.  Access to firearms.  Method of birth control.  Menstrual cycle.  Pregnancy history. What immunizations do I need? Vaccines are usually given at various ages, according to a schedule. Your health care provider will recommend vaccines for you based on your age, medical history, and lifestyle or other factors, such as travel or where you work.   What tests do I need? Blood tests  Lipid and cholesterol levels. These may be checked every 5 years, or more often if you are over 50 years old.  Hepatitis C test.  Hepatitis B test. Screening  Lung cancer screening. You may have this screening every year starting at age 50 if you have a 30-pack-year history of smoking and currently smoke or have quit within the past 15 years.  Colorectal cancer  screening. ? All adults should have this screening starting at age 50 and continuing until age 17. ? Your health care provider may recommend screening at age 50 if you are at increased risk. ? You will have tests every 1-10 years, depending on your results and the type of screening test.  Diabetes screening. ? This is done by checking your blood sugar (glucose) after you have not eaten for a while (fasting). ? You may have this done every 1-3 years.  Mammogram. ? This may be done every 1-2 years. ? Talk with your health care provider about when you should start having regular mammograms. This may depend on whether you have a family history of breast cancer.  BRCA-related cancer screening. This may be done if you have a family history of breast, ovarian, tubal, or peritoneal cancers.  Pelvic exam and Pap test. ? This may be done every 3 years starting at age 50. ? Starting at age 11, this may be done every 5 years if you have a Pap test in combination with an HPV test. Other tests  STD (sexually transmitted disease) testing, if you are at risk.  Bone density scan. This is done to screen for osteoporosis. You may have this scan if you are at high risk for osteoporosis. Talk with your health care provider about your test results, treatment options, and if necessary, the need for more tests. Follow these instructions at home: Eating and drinking  Eat a diet that includes fresh fruits and vegetables, whole grains, lean protein, and low-fat dairy products.  Take vitamin and mineral supplements  as recommended by your health care provider.  Do not drink alcohol if: ? Your health care provider tells you not to drink. ? You are pregnant, may be pregnant, or are planning to become pregnant.  If you drink alcohol: ? Limit how much you have to 0-1 drink a day. ? Be aware of how much alcohol is in your drink. In the [place] one drink equals one 12 oz bottle of beer (355 mL), one 5 oz glass of  wine (148 mL), or one 1 oz glass of hard liquor (44 mL).   Lifestyle  Take daily care of your teeth and gums. Brush your teeth every morning and night with fluoride toothpaste. Floss one time each day.  Stay active. Exercise for at least 30 minutes 5 or more days each week.  Do not use any products that contain nicotine or tobacco, such as cigarettes, e-cigarettes, and chewing tobacco. If you need help quitting, ask your health care provider.  Do not use drugs.  If you are sexually active, practice safe sex. Use a condom or other form of protection to prevent STIs (sexually transmitted infections).  If you do not wish to become pregnant, use a form of birth control. If you plan to become pregnant, see your health care provider for a prepregnancy visit.  If told by your health care provider, take low-dose aspirin daily starting at age 50.  Find healthy ways to cope with stress, such as: ? Meditation, yoga, or listening to music. ? Journaling. ? Talking to a trusted person. ? Spending time with friends and family. Safety  Always wear your seat belt while driving or riding in a vehicle.  Do not drive: ? If you have been drinking alcohol. Do not ride with someone who has been drinking. ? When you are tired or distracted. ? While texting.  Wear a helmet and other protective equipment during sports activities.  If you have firearms in your house, make sure you follow all gun safety procedures. What's next?  Visit your health care provider once a year for an annual wellness visit.  Ask your health care provider how often you should have your eyes and teeth checked.  Stay up to date on all vaccines. This information is not intended to replace advice given to you by your health care provider. Make sure you discuss any questions you have with your health care provider. Document Revised: 07/25/2020 Document Reviewed: 07/02/2018 Elsevier Patient Education  2021 Elsevier Inc.  

## 2021-01-02 LAB — HEMOGLOBIN A1C
Hgb A1c MFr Bld: 5.3 % of total Hgb (ref <5.7)
Mean Plasma Glucose: 105 mg/dL
eAG (mmol/L): 5.8 mmol/L

## 2021-01-02 LAB — LIPID PANEL
Cholesterol: 188 mg/dL (ref <200)
HDL: 61 mg/dL (ref >=50)
LDL Cholesterol (Calc): 108 mg/dL (calc) — ABNORMAL HIGH
Non-HDL Cholesterol (Calc): 127 mg/dL (calc) (ref <130)
Total CHOL/HDL Ratio: 3.1 (calc) (ref <5.0)
Triglycerides: 99 mg/dL (ref <150)

## 2021-01-02 LAB — TSH: TSH: 2.77 mIU/L

## 2021-01-22 ENCOUNTER — Telehealth (INDEPENDENT_AMBULATORY_CARE_PROVIDER_SITE_OTHER): Payer: Self-pay | Admitting: Gastroenterology

## 2021-01-22 ENCOUNTER — Other Ambulatory Visit: Payer: Self-pay

## 2021-01-22 DIAGNOSIS — Z1211 Encounter for screening for malignant neoplasm of colon: Secondary | ICD-10-CM

## 2021-01-22 MED ORDER — PEG 3350-KCL-NA BICARB-NACL 420 G PO SOLR: 4000.0000 mL | Freq: Once | ORAL | 0 refills | Status: AC

## 2021-01-22 NOTE — Progress Notes (Signed)
Gastroenterology Pre-Procedure Review  Request Date: Friday 02/23/21 Requesting Physician: Dr. Vicente Males  PATIENT REVIEW QUESTIONS: The patient responded to the following health history questions as indicated:    1. Are you having any GI issues? no 2. Do you have a personal history of Polyps? no 3. Do you have a family history of Colon Cancer or Polyps? Maternal mother colon polyps, maternal grandmother colon cancer 4. Diabetes Mellitus? no 5. Joint replacements in the past 12 months?no 6. Major health problems in the past 3 months?no 7. Any artificial heart valves, MVP, or defibrillator?no    MEDICATIONS & ALLERGIES:    Patient reports the following regarding taking any anticoagulation/antiplatelet therapy:   Plavix, Coumadin, Eliquis, Xarelto, Lovenox, Pradaxa, Brilinta, or Effient? no Aspirin? no  Patient confirms/reports the following medications:  Current Outpatient Medications  Medication Sig Dispense Refill  . Clobetasol Propionate Emulsion 0.05 % topical foam Apply 1 application topically 2 (two) times daily. Apply to scalp only, wash hands thoroughly after use. 100 g 0  . esomeprazole (NEXIUM) 40 MG capsule Take 1 capsule (40 mg total) by mouth daily. 90 capsule 1  . PARoxetine (PAXIL) 20 MG tablet Take 20 mg by mouth daily.    Marland Kitchen sulfaSALAzine (AZULFIDINE) 500 MG tablet Take 1 tablet by mouth daily.    Marland Kitchen triamcinolone ointment (KENALOG) 0.1 % Apply 1 application topically 2 (two) times daily. 80 g 0   No current facility-administered medications for this visit.    Patient confirms/reports the following allergies:  No Known Allergies  No orders of the defined types were placed in this encounter.   AUTHORIZATION INFORMATION Primary Insurance: 1D#: Group #:  Secondary Insurance: 1D#: Group #:  SCHEDULE INFORMATION: Date: Friday 02/23/21 Time: Location:[facility]

## 2021-01-30 ENCOUNTER — Inpatient Hospital Stay: Payer: BC Managed Care – PPO | Admitting: Licensed Clinical Social Worker

## 2021-01-30 ENCOUNTER — Inpatient Hospital Stay: Payer: BC Managed Care – PPO

## 2021-02-15 DIAGNOSIS — M170 Bilateral primary osteoarthritis of knee: Secondary | ICD-10-CM | POA: Insufficient documentation

## 2021-02-22 ENCOUNTER — Encounter: Payer: Self-pay | Admitting: Gastroenterology

## 2021-02-23 ENCOUNTER — Encounter
Admission: RE | Disposition: A | Discharge status: Home / Self Care | Payer: Self-pay | Source: Home / Self Care | Attending: Gastroenterology

## 2021-02-23 ENCOUNTER — Ambulatory Visit: Payer: BC Managed Care – PPO | Admitting: Registered Nurse

## 2021-02-23 ENCOUNTER — Encounter: Payer: Self-pay | Admitting: Gastroenterology

## 2021-02-23 ENCOUNTER — Ambulatory Visit
Admission: RE | Admit: 2021-02-23 | Discharge: 2021-02-23 | Disposition: A | Discharge status: Home / Self Care | Payer: BC Managed Care – PPO | Attending: Gastroenterology | Admitting: Gastroenterology

## 2021-02-23 DIAGNOSIS — K635 Polyp of colon: Secondary | ICD-10-CM

## 2021-02-23 DIAGNOSIS — Z1211 Encounter for screening for malignant neoplasm of colon: Secondary | ICD-10-CM | POA: Insufficient documentation

## 2021-02-23 DIAGNOSIS — Z8042 Family history of malignant neoplasm of prostate: Secondary | ICD-10-CM | POA: Diagnosis not present

## 2021-02-23 DIAGNOSIS — D120 Benign neoplasm of cecum: Secondary | ICD-10-CM | POA: Diagnosis not present

## 2021-02-23 DIAGNOSIS — Z8249 Family history of ischemic heart disease and other diseases of the circulatory system: Secondary | ICD-10-CM | POA: Insufficient documentation

## 2021-02-23 DIAGNOSIS — Z803 Family history of malignant neoplasm of breast: Secondary | ICD-10-CM | POA: Insufficient documentation

## 2021-02-23 DIAGNOSIS — Z8349 Family history of other endocrine, nutritional and metabolic diseases: Secondary | ICD-10-CM | POA: Insufficient documentation

## 2021-02-23 DIAGNOSIS — K219 Gastro-esophageal reflux disease without esophagitis: Secondary | ICD-10-CM | POA: Diagnosis not present

## 2021-02-23 HISTORY — PX: COLONOSCOPY WITH PROPOFOL: SHX5780

## 2021-02-23 HISTORY — DX: Gastro-esophageal reflux disease without esophagitis: K21.9

## 2021-02-23 SURGERY — COLONOSCOPY WITH PROPOFOL
Anesthesia: General

## 2021-02-23 MED ORDER — SODIUM CHLORIDE 0.9 % IV SOLN: INTRAVENOUS | Status: DC

## 2021-02-23 MED ORDER — PROPOFOL 500 MG/50ML IV EMUL
INTRAVENOUS | Status: DC | PRN
  Administered 2021-02-23: 150 ug/kg/min via INTRAVENOUS

## 2021-02-23 MED ORDER — PROPOFOL 500 MG/50ML IV EMUL
INTRAVENOUS | Status: AC
  Filled 2021-02-23: qty 50

## 2021-02-23 MED ORDER — PROPOFOL 10 MG/ML IV BOLUS
INTRAVENOUS | Status: DC | PRN
  Administered 2021-02-23: 20 mg via INTRAVENOUS
  Administered 2021-02-23: 80 mg via INTRAVENOUS

## 2021-02-23 MED ORDER — LIDOCAINE HCL (CARDIAC) PF 100 MG/5ML IV SOSY
PREFILLED_SYRINGE | INTRAVENOUS | Status: DC | PRN
  Administered 2021-02-23: 100 mg via INTRAVENOUS

## 2021-02-23 NOTE — Anesthesia Postprocedure Evaluation (Signed)
Anesthesia Post Note  Patient: Rita Martinez  Procedure(s) Performed: COLONOSCOPY WITH PROPOFOL (N/A )  Patient location during evaluation: Endoscopy Anesthesia Type: General Level of consciousness: awake and alert Pain management: pain level controlled Vital Signs Assessment: post-procedure vital signs reviewed and stable Respiratory status: spontaneous breathing and respiratory function stable Cardiovascular status: stable Anesthetic complications: no   No complications documented.   Last Vitals:  Vitals:   02/23/21 0815 02/23/21 0916  BP: (!) 122/91 127/79  Pulse: 77 91  Resp: 18 18  Temp: (!) 35.9 C 36.4 C  SpO2: 100%     Last Pain:  Vitals:   02/23/21 0916  TempSrc: Temporal  PainSc: 0-No pain                 Satoru Milich K

## 2021-02-23 NOTE — Op Note (Signed)
[facility] Patient Name: Rita Martinez Procedure Date: 02/23/2021 8:48 AM MRN: [id number] Account #: [id number] Date of Birth: 28-Jul-1971 Admit Type: Outpatient Age: 50 Room: [facility] ENDO ROOM 4 Gender: Female Note Status: Finalized Procedure:             Colonoscopy Indications:           Screening for colorectal malignant neoplasm Providers:             Jonathon Bellows MD, MD Referring MD:          Delsa Grana (Referring MD) Medicines:             Monitored Anesthesia Care Complications:         No immediate complications. Procedure:             Pre-Anesthesia Assessment:                        - Prior to the procedure, a History and Physical was                         performed, and patient medications, allergies and                         sensitivities were reviewed. The patient's tolerance                         of previous anesthesia was reviewed.                        - The risks and benefits of the procedure and the                         sedation options and risks were discussed with the                         patient. All questions were answered and informed                         consent was obtained.                        - ASA Grade Assessment: II - A patient with mild                         systemic disease.                        After obtaining informed consent, the colonoscope was                         passed under direct vision. Throughout the procedure,                         the patient's blood pressure, pulse, and oxygen                         saturations were monitored continuously. The                         Colonoscope was introduced through the anus and  advanced to the the cecum, identified by the                         appendiceal orifice. The colonoscopy was performed                         with ease. The patient tolerated the procedure well.                         The quality of the  bowel preparation was excellent. Findings:      The perianal and digital rectal examinations were normal.      A 10 mm polyp was found in the cecum. The polyp was sessile. The polyp       was removed with a cold snare. Resection and retrieval were complete.      The exam was otherwise without abnormality on direct and retroflexion       views. Impression:            - One 10 mm polyp in the cecum, removed with a cold                         snare. Resected and retrieved.                        - The examination was otherwise normal on direct and                         retroflexion views. Recommendation:        - Discharge patient to home (with escort).                        - Resume previous diet.                        - Await pathology results.                        - Repeat colonoscopy in 3 years for surveillance. Procedure Code(s):     --- Professional ---                        [phone], Colonoscopy, flexible; with removal of                         tumor(s), polyp(s), or other lesion(s) by snare                         technique Diagnosis Code(s):     --- Professional ---                        Z12.11, Encounter for screening for malignant neoplasm                         of colon                        K63.5, Polyp of colon CPT copyright 2019 American Medical Association. All rights reserved. The codes documented in this report are preliminary and upon coder review may  be revised to meet current  compliance requirements. Jonathon Bellows, MD Jonathon Bellows MD, MD 02/23/2021 9:15:09 AM This report has been signed electronically. Number of Addenda: 0 Note Initiated On: 02/23/2021 8:48 AM Scope Withdrawal Time: 0 hours 11 minutes 0 seconds  Total Procedure Duration: 0 hours 14 minutes 6 seconds  Estimated Blood Loss:  Estimated blood loss: none.      [facility]

## 2021-02-23 NOTE — H&P (Signed)
Jonathon Bellows, MD [address], [place], [place], [place], [address] [address], [place], [place], [place], [address] Phone: [phone]  Fax: [phone]  Primary Care Physician:  Delsa Grana, PA-C   Pre-Procedure History & Physical: HPI:  Rita Martinez is a 50 y.o. female is here for an colonoscopy.   Past Medical History:  Diagnosis Date  . GERD (gastroesophageal reflux disease)     Past Surgical History:  Procedure Laterality Date  . AUGMENTATION MAMMAPLASTY Bilateral 1990's   saline  . BREAST ENHANCEMENT SURGERY    . BREAST LUMPECTOMY Left 1990's   benign  . TONSILLECTOMY      Prior to Admission medications   Medication Sig Start Date End Date Taking? Authorizing Provider  esomeprazole (NEXIUM) 40 MG capsule Take 1 capsule (40 mg total) by mouth daily. 01/01/21  Yes Sowles, Drue Stager, MD  PARoxetine (PAXIL) 20 MG tablet Take 20 mg by mouth daily. 10/10/20  Yes McVey, Murray Hodgkins, CNM  sulfaSALAzine (AZULFIDINE) 500 MG tablet Take 1 tablet by mouth daily. 05/01/16  Yes Girard Cooter, MD  Clobetasol Propionate Emulsion 0.05 % topical foam Apply 1 application topically 2 (two) times daily. Apply to scalp only, wash hands thoroughly after use. 01/01/21   Steele Sizer, MD  triamcinolone ointment (KENALOG) 0.1 % Apply 1 application topically 2 (two) times daily. 01/01/21   Steele Sizer, MD    Allergies as of 01/22/2021  . (No Known Allergies)    Family History  Problem Relation Age of Onset  . Breast cancer Mother 45  . High Cholesterol Mother   . Multiple myeloma Mother   . Prostate cancer Father   . High Cholesterol Father   . Hypertension Father     Social History   Socioeconomic History  . Marital status: Single    Spouse name: Not on file  . Number of children: 2  . Years of education: Not on file  . Highest education level: Not on file  Occupational History  . Not on file  Tobacco Use  . Smoking status: Never Smoker  . Smokeless  tobacco: Never Used  Vaping Use  . Vaping Use: Never used  Substance and Sexual Activity  . Alcohol use: Never  . Drug use: Never  . Sexual activity: Yes    Partners: Male    Birth control/protection: Surgical  Other Topics Concern  . Not on file  Social History Narrative   Lives with her son Raynelle Jan.    Social Determinants of Health   Financial Resource Strain: Low Risk   . Difficulty of Paying Living Expenses: Not hard at all  Food Insecurity: No Food Insecurity  . Worried About Charity fundraiser in the Last Year: Never true  . Ran Out of Food in the Last Year: Never true  Transportation Needs: No Transportation Needs  . Lack of Transportation (Medical): No  . Lack of Transportation (Non-Medical): No  Physical Activity: Inactive  . Days of Exercise per Week: 0 days  . Minutes of Exercise per Session: 0 min  Stress: No Stress Concern Present  . Feeling of Stress : Not at all  Social Connections: Socially Isolated  . Frequency of Communication with Friends and Family: More than three times a week  . Frequency of Social Gatherings with Friends and Family: Once a week  . Attends Religious Services: Never  . Active Member of Clubs or Organizations: No  . Attends Archivist Meetings: Never  .  Marital Status: Divorced  Human resources officer Violence: Not At Risk  . Fear of Current or Ex-Partner: No  . Emotionally Abused: No  . Physically Abused: No  . Sexually Abused: No    Review of Systems: See HPI, otherwise negative ROS  Physical Exam: BP (!) 122/91   Pulse 77   Temp (!) 96.6 F (35.9 C) (Temporal)   Resp 18   Ht _0  (1.575 m)   Wt 78 kg   SpO2 100%   BMI 31.46 kg/m  General:   Alert,  pleasant and cooperative in NAD Head:  Normocephalic and atraumatic. Neck:  Supple; no masses or thyromegaly. Lungs:  Clear throughout to auscultation, normal respiratory effort.    Heart:  +S1, +S2, Regular rate and rhythm, No edema. Abdomen:  Soft, nontender  and nondistended. Normal bowel sounds, without guarding, and without rebound.   Neurologic:  Alert and  oriented x4;  grossly normal neurologically.  Impression/Plan: Rita Martinez is here for an colonoscopy to be performed for Screening colonoscopy average risk   Risks, benefits, limitations, and alternatives regarding  colonoscopy have been reviewed with the patient.  Questions have been answered.  All parties agreeable.   Jonathon Bellows, MD  02/23/2021, 8:51 AM

## 2021-02-23 NOTE — Anesthesia Preprocedure Evaluation (Signed)
Anesthesia Evaluation  Patient identified by MRN, date of birth, ID band Patient awake    Reviewed: Allergy & Precautions, NPO status , Patient's Chart, lab work & pertinent test results  History of Anesthesia Complications Negative for: history of anesthetic complications  Airway Mallampati: II       Dental   Pulmonary neg sleep apnea, neg COPD, Not current smoker,           Cardiovascular (-) hypertension(-) Past MI and (-) CHF (-) dysrhythmias (-) Valvular Problems/Murmurs     Neuro/Psych neg Seizures Anxiety    GI/Hepatic Neg liver ROS, GERD  Medicated and Controlled,  Endo/Other  neg diabetes  Renal/GU negative Renal ROS     Musculoskeletal   Abdominal   Peds  Hematology   Anesthesia Other Findings   Reproductive/Obstetrics                             Anesthesia Physical Anesthesia Plan  ASA: II  Anesthesia Plan: General   Post-op Pain Management:    Induction: Intravenous  PONV Risk Score and Plan: 3 and Propofol infusion, TIVA and Treatment may vary due to age or medical condition  Airway Management Planned: Nasal Cannula  Additional Equipment:   Intra-op Plan:   Post-operative Plan:   Informed Consent: I have reviewed the patients History and Physical, chart, labs and discussed the procedure including the risks, benefits and alternatives for the proposed anesthesia with the patient or authorized representative who has indicated his/her understanding and acceptance.       Plan Discussed with:   Anesthesia Plan Comments:         Anesthesia Quick Evaluation

## 2021-02-23 NOTE — Transfer of Care (Signed)
Immediate Anesthesia Transfer of Care Note  Patient: Rita Martinez  Procedure(s) Performed: COLONOSCOPY WITH PROPOFOL (N/A )  Patient Location: Endoscopy Unit  Anesthesia Type:General  Level of Consciousness: drowsy  Airway & Oxygen Therapy: Patient Spontanous Breathing  Post-op Assessment: Report given to RN and Post -op Vital signs reviewed and stable  Post vital signs: Reviewed and stable  Last Vitals:  Vitals Value Taken Time  BP 127/79 02/23/21 0917  Temp    Pulse 90 02/23/21 0917  Resp 15 02/23/21 0917  SpO2 96 % 02/23/21 0917  Vitals shown include unvalidated device data.  Last Pain:  Vitals:   02/23/21 0815  TempSrc: Temporal  PainSc: 0-No pain         Complications: No complications documented.

## 2021-02-26 ENCOUNTER — Encounter: Payer: Self-pay | Admitting: Gastroenterology

## 2021-02-26 LAB — SURGICAL PATHOLOGY

## 2021-02-27 ENCOUNTER — Encounter: Payer: Self-pay | Admitting: Gastroenterology

## 2021-07-09 ENCOUNTER — Other Ambulatory Visit: Payer: Self-pay | Admitting: Family Medicine

## 2021-07-09 DIAGNOSIS — K219 Gastro-esophageal reflux disease without esophagitis: Secondary | ICD-10-CM

## 2021-10-08 ENCOUNTER — Encounter: Payer: Self-pay | Admitting: Nurse Practitioner

## 2021-10-08 ENCOUNTER — Ambulatory Visit: Payer: BC Managed Care – PPO | Admitting: Nurse Practitioner

## 2021-10-08 ENCOUNTER — Other Ambulatory Visit: Payer: Self-pay

## 2021-10-08 VITALS — BP 110/72 | HR 77 | Temp 98.2°F | Resp 16 | Ht 62.0 in | Wt 181.9 lb

## 2021-10-08 DIAGNOSIS — L0201 Cutaneous abscess of face: Secondary | ICD-10-CM | POA: Diagnosis not present

## 2021-10-08 DIAGNOSIS — B379 Candidiasis, unspecified: Secondary | ICD-10-CM | POA: Diagnosis not present

## 2021-10-08 DIAGNOSIS — T3695XA Adverse effect of unspecified systemic antibiotic, initial encounter: Secondary | ICD-10-CM | POA: Diagnosis not present

## 2021-10-08 DIAGNOSIS — R102 Pelvic and perineal pain: Secondary | ICD-10-CM | POA: Insufficient documentation

## 2021-10-08 MED ORDER — CEPHALEXIN 500 MG PO CAPS: 500.0000 mg | ORAL_CAPSULE | Freq: Four times a day (QID) | ORAL | 0 refills | Status: AC

## 2021-10-08 MED ORDER — FLUCONAZOLE 150 MG PO TABS: 150.0000 mg | ORAL_TABLET | Freq: Once | ORAL | 0 refills | Status: AC

## 2021-10-08 NOTE — Progress Notes (Signed)
BP 110/72   Pulse 77   Temp 98.2 F (36.8 C) (Oral)   Resp 16   Ht 5\' 2"  (1.575 m)   Wt 181 lb 14.4 oz (82.5 kg)   SpO2 98%   BMI 33.27 kg/m    Subjective:    Patient ID: Rita Martinez, female    DOB: [birth year], 50 y.o.   MRN: [id number]  HPI: Rita Martinez is a 50 y.o. female, here alone  Chief Complaint  Patient presents with   Cyst    On forehead since yesterday   Cyst:  She noticed a lump on her forehead yesterday and woke up and it was bigger today.  She denies fever. She denies any trauma, acne or insect bite that she is aware of.  Sh says it is a little tender on palpation but does not hurt if left alone. Discussed treatment plan. Will prescribe antibiotics, apply warm compresses at least four times a day.  Follow-up if not better.    History of antibiotic induced yeast infections:  She says that when she takes antibiotics she often gets a yeast infection. Will prescribe diflucan if she needs it.    Relevant past medical, surgical, family and social history reviewed and updated as indicated. Interim medical history since our last visit reviewed. Allergies and medications reviewed and updated.  Review of Systems  Constitutional: Negative for fever or weight change.  Respiratory: Negative for cough and shortness of breath.   Cardiovascular: Negative for chest pain or palpitations.  Gastrointestinal: Negative for abdominal pain, no bowel changes.  Musculoskeletal: Negative for gait problem or joint swelling.  Skin: Negative for rash. Positive for lump on forehead Neurological: Negative for dizziness or headache.  No other specific complaints in a complete review of systems (except as listed in HPI above).      Objective:    BP 110/72   Pulse 77   Temp 98.2 F (36.8 C) (Oral)   Resp 16   Ht 5\' 2"  (1.575 m)   Wt 181 lb 14.4 oz (82.5 kg)   SpO2 98%   BMI 33.27 kg/m   Wt Readings from Last 3 Encounters:  10/08/21 181 lb 14.4 oz (82.5 kg)  02/23/21 172 lb (78  kg)  01/01/21 168 lb (76.2 kg)    Physical Exam  Constitutional: Patient appears well-developed and well-nourished. No distress.  HEENT: head atraumatic, normocephalic,  pupils equal and reactive to light, neck supple Cardiovascular: Normal rate, regular rhythm and normal heart sounds.  No murmur heard. No BLE edema. Pulmonary/Chest: Effort normal and breath sounds normal. No respiratory distress. Abdominal: Soft.  There is no tenderness. Skin: Abscess noted to forehead measuring 1 x 2 cm.   Psychiatric: Patient has a normal mood and affect. behavior is normal. Judgment and thought content normal.   Results for orders placed or performed during the hospital encounter of 02/23/21  Surgical pathology  Result Value Ref Range   SURGICAL PATHOLOGY      SURGICAL PATHOLOGY CASE: [id number] PATIENT: Rita Martinez Surgical Pathology Report     Specimen Submitted: A. Colon polyp, cecum; cold snare  Clinical History: Screening colonoscopy.  Finding: Colon polyp     DIAGNOSIS: A.  COLON POLYP, CECUM; COLD SNARE: - TUBULAR ADENOMA, MULTIPLE FRAGMENTS. - NEGATIVE FOR HIGH-GRADE DYSPLASIA AND MALIGNANCY.  GROSS DESCRIPTION: A. Labeled: Cecum polyp cold snare Received: Formalin Collection time: 9:04 AM on 02/23/2021 Placed into formalin time: 9:04 AM on 02/23/2021 Tissue fragment(s): Multiple Size: Aggregate, 0.9 x 0.5 x  0.1 cm Description: Tan translucent soft tissue fragments Entirely submitted in 1 cassette.  RB 02/23/2021  Final Diagnosis performed by Bryan Lemma, MD.   Electronically signed 02/26/2021 4:32:04PM The electronic signature indicates that the named Attending Pathologist has evaluated the specimen Technical component performed at [facility], [address] [place], [place] [address] Lab: [phone]  Dir: Rush Farmer, MD, MMM  Professional component performed at [facility], [facility], [place], [place], [place] [address] Lab:  [phone] Dir: Dellia Nims. Reuel Derby, MD       Assessment & Plan:   1. Cutaneous abscess of face -apply warm compresses at least four times a day - cephALEXin (KEFLEX) 500 MG capsule; Take 1 capsule (500 mg total) by mouth 4 (four) times daily for 7 days.  Dispense: 28 capsule; Refill: 0  2. Antibiotic-induced yeast infection -eat yogurt  - fluconazole (DIFLUCAN) 150 MG tablet; Take 1 tablet (150 mg total) by mouth once for 1 dose.  Dispense: 1 tablet; Refill: 0   Follow up plan: Return if symptoms worsen or fail to improve.

## 2022-01-02 ENCOUNTER — Ambulatory Visit (INDEPENDENT_AMBULATORY_CARE_PROVIDER_SITE_OTHER): Payer: BC Managed Care – PPO | Admitting: Family Medicine

## 2022-01-02 ENCOUNTER — Other Ambulatory Visit: Payer: Self-pay

## 2022-01-02 ENCOUNTER — Encounter: Payer: Self-pay | Admitting: Family Medicine

## 2022-01-02 VITALS — BP 128/80 | HR 95 | Temp 98.3°F | Resp 16 | Ht 62.0 in | Wt 179.1 lb

## 2022-01-02 DIAGNOSIS — Z01419 Encounter for gynecological examination (general) (routine) without abnormal findings: Secondary | ICD-10-CM | POA: Diagnosis not present

## 2022-01-02 DIAGNOSIS — Z13220 Encounter for screening for lipoid disorders: Secondary | ICD-10-CM

## 2022-01-02 DIAGNOSIS — Z23 Encounter for immunization: Secondary | ICD-10-CM

## 2022-01-02 DIAGNOSIS — Z8349 Family history of other endocrine, nutritional and metabolic diseases: Secondary | ICD-10-CM

## 2022-01-02 DIAGNOSIS — Z1231 Encounter for screening mammogram for malignant neoplasm of breast: Secondary | ICD-10-CM

## 2022-01-02 DIAGNOSIS — N83201 Unspecified ovarian cyst, right side: Secondary | ICD-10-CM | POA: Diagnosis not present

## 2022-01-02 DIAGNOSIS — Z79899 Other long term (current) drug therapy: Secondary | ICD-10-CM

## 2022-01-02 DIAGNOSIS — N83202 Unspecified ovarian cyst, left side: Secondary | ICD-10-CM

## 2022-01-02 DIAGNOSIS — Z131 Encounter for screening for diabetes mellitus: Secondary | ICD-10-CM

## 2022-01-02 NOTE — Progress Notes (Signed)
Name: Rita Martinez   MRN: [id number]    DOB: 1971/03/13   Date:01/02/2022       Progress Note  Subjective  Chief Complaint  Annual Exam  HPI  Patient presents for annual CPE.  Diet: she eats a balanced diet, avoids fried food  Exercise: discussed 150 minutes per week, she has a new exercise machine at home- rowing , but has not been consistent yet , discussed 20 minutes per day    Flowsheet Row Office Visit from 01/02/2022 in [facility]  AUDIT-C Score 0      Depression: Phq 9 is  negative Depression screen [facility] 2/9 01/02/2022 10/08/2021 01/01/2021 10/22/2018 09/24/2018  Decreased Interest 0 0 0 0 0  Down, Depressed, Hopeless 0 0 0 0 0  PHQ - 2 Score 0 0 0 0 0  Altered sleeping 0 0 0 0 0  Tired, decreased energy 0 0 0 0 0  Change in appetite 0 0 0 0 0  Feeling bad or failure about yourself  0 0 0 0 0  Trouble concentrating 0 0 0 0 0  Moving slowly or fidgety/restless 0 0 0 0 0  Suicidal thoughts 0 0 0 0 0  PHQ-9 Score 0 0 0 0 0  Difficult doing work/chores Not difficult at all Not difficult at all - Not difficult at all Not difficult at all   Hypertension: BP Readings from Last 3 Encounters:  01/02/22 128/80  10/08/21 110/72  02/23/21 110/73   Obesity: Wt Readings from Last 3 Encounters:  01/02/22 179 lb 1.6 oz (81.2 kg)  10/08/21 181 lb 14.4 oz (82.5 kg)  02/23/21 172 lb (78 kg)   BMI Readings from Last 3 Encounters:  01/02/22 32.76 kg/m  10/08/21 33.27 kg/m  02/23/21 31.46 kg/m     Vaccines:   HPV: N/A Tdap: up to date Shingrix: she will contact insurance about coverage  Pneumonia: today  Flu: up to date  COVID-19: discussed bivalent booster    Hep C Screening: up to date  STD testing and prevention (HIV/chl/gon/syphilis): not interested  Intimate partner violence: negative Sexual History : no pain or discomfort - same partner for years  Menstrual History/LMP/Abnormal Bleeding: s/p hysterectomy under the care of gyn because she was  having some pelvic pain, US showed cysts on both sides, left side calcified and she will go back for repeat US  Discussed importance of follow up if any post-menopausal bleeding: not applicable Incontinence Symptoms: Yes.  Only seldom when jumping , discussed kegel exercises   Breast cancer:  - Last Mammogram: was 2021  - BRCA gene screening:  mother had breast cancer, no other cancer history   Osteoporosis Prevention : Discussed high calcium and vitamin D supplementation, weight bearing exercises Bone density :not applicable  Cervical cancer screening: N/A   Skin cancer: Discussed monitoring for atypical lesions  Colorectal cancer: up to date    Lung cancer:  Low Dose CT Chest recommended if Age 66-80 years, 20 pack-year currently smoking OR have quit w/in 15years. Patient does not qualify.   ECG: 01/22   Advanced Care Planning: A voluntary discussion about advance care planning including the explanation and discussion of advance directives.  Discussed health care proxy and Living will, and the patient was able to identify a health care proxy as daughter .  Patient does not  have a LW or power of attorney of health care   Lipids: Lab Results  Component Value Date   CHOL 188 01/01/2021  CHOL 157 09/24/2018   Lab Results  Component Value Date   HDL 61 01/01/2021   HDL 70 09/24/2018   Lab Results  Component Value Date   LDLCALC 108 (H) 01/01/2021   LDLCALC 73 09/24/2018   Lab Results  Component Value Date   TRIG 99 01/01/2021   TRIG 59 09/24/2018   Lab Results  Component Value Date   CHOLHDL 3.1 01/01/2021   CHOLHDL 2.2 09/24/2018   No results found for: LDLDIRECT  Glucose: Glucose, Bld  Date Value Ref Range Status  11/14/2020 95 70 - 99 mg/dL Final    Comment:    Glucose reference range applies only to samples taken after fasting for at least 8 hours.  09/24/2018 75 65 - 139 mg/dL Final    Comment:    .        Non-fasting reference interval .     Patient  Active Problem List   Diagnosis Date Noted   Pelvic pain in female 10/08/2021   Primary osteoarthritis of both knees 02/15/2021   Psoriasis of scalp 10/22/2018   Chest pain 09/24/2018   Class 1 obesity due to excess calories without serious comorbidity with body mass index (BMI) of 31.0 to 31.9 in adult 09/24/2018   Rheumatoid arthritis (HCC) 11/06/2011   Gastro-esophageal reflux disease without esophagitis 02/14/2011   Anxiety state 08/26/2007   Vitamin D deficiency 08/26/2007    Past Surgical History:  Procedure Laterality Date   AUGMENTATION MAMMAPLASTY Bilateral 1990's   saline   BREAST ENHANCEMENT SURGERY     BREAST LUMPECTOMY Left 1990's   benign   COLONOSCOPY WITH PROPOFOL N/A 02/23/2021   Procedure: COLONOSCOPY WITH PROPOFOL;  Surgeon: Wyline Mood, MD;  Location: [facility] ENDOSCOPY;  Service: Gastroenterology;  Laterality: N/A;   TONSILLECTOMY      Family History  Problem Relation Age of Onset   Breast cancer Mother 52   High Cholesterol Mother    Multiple myeloma Mother    Prostate cancer Father    High Cholesterol Father    Hypertension Father     Social History   Socioeconomic History   Marital status: Single    Spouse name: Not on file   Number of children: 2   Years of education: Not on file   Highest education level: Not on file  Occupational History   Not on file  Tobacco Use   Smoking status: Never   Smokeless tobacco: Never  Vaping Use   Vaping Use: Never used  Substance and Sexual Activity   Alcohol use: Never   Drug use: Never   Sexual activity: Yes    Partners: Male    Birth control/protection: Surgical  Other Topics Concern   Not on file  Social History Narrative   Lives with her son Darrow Bussing Gwynneth Aliment.    Social Determinants of Health   Financial Resource Strain: Low Risk    Difficulty of Paying Living Expenses: Not hard at all  Food Insecurity: No Food Insecurity   Worried About Programme researcher, broadcasting/film/video in the Last Year: Never true   Ran Out  of Food in the Last Year: Never true  Transportation Needs: No Transportation Needs   Lack of Transportation (Medical): No   Lack of Transportation (Non-Medical): No  Physical Activity: Insufficiently Active   Days of Exercise per Week: 2 days   Minutes of Exercise per Session: 30 min  Stress: Stress Concern Present   Feeling of Stress : Rather much  Social Connections: Socially Isolated  Frequency of Communication with Friends and Family: More than three times a week   Frequency of Social Gatherings with Friends and Family: More than three times a week   Attends Religious Services: Never   Database administrator or Organizations: No   Attends Engineer, structural: Never   Marital Status: Never married  Catering manager Violence: Not At Risk   Fear of Current or Ex-Partner: No   Emotionally Abused: No   Physically Abused: No   Sexually Abused: No     Current Outpatient Medications:    Clobetasol Propionate Emulsion 0.05 % topical foam, Apply 1 application topically 2 (two) times daily. Apply to scalp only, wash hands thoroughly after use., Disp: 100 g, Rfl: 0   esomeprazole (NEXIUM) 40 MG capsule, TAKE 1 CAPSULE (40 MG TOTAL) BY MOUTH DAILY., Disp: 90 capsule, Rfl: 1   fluconazole (DIFLUCAN) 150 MG tablet, Take 150 mg by mouth once., Disp: , Rfl:    HUMIRA PEN 40 MG/0.4ML PNKT, SMARTSIG:40 Milligram(s) SUB-Q Every 2 Weeks, Disp: , Rfl:    metroNIDAZOLE (METROGEL) 0.75 % vaginal gel, Metrogel Vaginal 0.75 % (37.5 mg/5 gram)  Insert 1 applicatorful every day by vaginal route for 5 days., Disp: , Rfl:    PARoxetine (PAXIL) 20 MG tablet, Take 20 mg by mouth daily., Disp: , Rfl:    predniSONE (DELTASONE) 10 MG tablet, PLEASE SEE ATTACHED FOR DETAILED DIRECTIONS, Disp: , Rfl:    sulfamethoxazole-trimethoprim (BACTRIM DS) 800-160 MG tablet, Take 1 tablet by mouth 2 (two) times daily., Disp: , Rfl:    sulfaSALAzine (AZULFIDINE) 500 MG tablet, Take 1 tablet by mouth daily., Disp: ,  Rfl:    triamcinolone ointment (KENALOG) 0.1 %, Apply 1 application topically 2 (two) times daily., Disp: 80 g, Rfl: 0   triamcinolone ointment (KENALOG) 0.1 %, Apply topically., Disp: , Rfl:   No Known Allergies   ROS  Constitutional: Negative for fever or weight change.  Respiratory: Negative for cough and shortness of breath.   Cardiovascular: Negative for chest pain or palpitations.  Gastrointestinal: Negative for abdominal pain, no bowel changes.  Musculoskeletal: Negative for gait problem or joint swelling.  Skin: Negative for rash.  Neurological: Negative for dizziness or headache.  No other specific complaints in a complete review of systems (except as listed in HPI above).   Objective  Vitals:   01/02/22 1547  BP: 128/80  Pulse: 95  Resp: 16  Temp: 98.3 F (36.8 C)  TempSrc: Oral  SpO2: 98%  Weight: 179 lb 1.6 oz (81.2 kg)  Height: 5\' 2"  (1.575 m)    Body mass index is 32.76 kg/m.  Physical Exam  Constitutional: Patient appears well-developed and well-nourished. No distress.  HENT: Head: Normocephalic and atraumatic. Ears: B TMs ok, no erythema or effusion; Nose: Nose normal. Mouth/Throat: Oropharynx is clear and moist. No oropharyngeal exudate.  Eyes: Conjunctivae and EOM are normal. Pupils are equal, round, and reactive to light. No scleral icterus.  Neck: Normal range of motion. Neck supple. No JVD present. No thyromegaly present.  Cardiovascular: Normal rate, regular rhythm and normal heart sounds.  No murmur heard. No BLE edema. Pulmonary/Chest: Effort normal and breath sounds normal. No respiratory distress. Abdominal: Soft. Bowel sounds are normal, no distension. There is no tenderness. no masses Breast: no lumps or masses, no nipple discharge or rashes FEMALE GENITALIA:  External genitalia normal External urethra normal Vaginal vault normal without discharge or lesions Cervix normal without discharge or lesions Bimanual exam normal without  masses RECTAL: no rectal masses or hemorrhoids Musculoskeletal: Normal range of motion, no joint effusions. No gross deformities Neurological: he is alert and oriented to person, place, and time. No cranial nerve deficit. Coordination, balance, strength, speech and gait are normal.  Skin: Skin is warm and dry. No rash noted. No erythema.  Psychiatric: Patient has a normal mood and affect. behavior is normal. Judgment and thought content normal.   Fall Risk: Fall Risk  01/02/2022 10/08/2021 01/01/2021 10/22/2018 09/24/2018  Falls in the past year? 0 1 0 0 0  Number falls in past yr: 0 1 0 0 0  Injury with Fall? 0 - 0 0 0  Risk for fall due to : No Fall Risks History of fall(s) - - -  Follow up Falls prevention discussed Falls evaluation completed - - -     Functional Status Survey: Is the patient deaf or have difficulty hearing?: No Does the patient have difficulty seeing, even when wearing glasses/contacts?: No Does the patient have difficulty concentrating, remembering, or making decisions?: No Does the patient have difficulty walking or climbing stairs?: Yes Does the patient have difficulty dressing or bathing?: No Does the patient have difficulty doing errands alone such as visiting a doctor's office or shopping?: No   Assessment & Plan  1. Cysts of both ovaries  Seeing gyn   2. Need for pneumococcal vaccine  - Pneumococcal conjugate vaccine 20-valent (Prevnar 20)  3. Need for shingles vaccine  She will check with insurance   4. Family history of thyroid disease  - TSH  5. Lipid screening  - Lipid panel  6. Diabetes mellitus screening  - Hemoglobin A1c  7. Well woman exam  - Lipid panel - COMPLETE METABOLIC PANEL WITH GFR - Hemoglobin A1c - TSH  8. Long-term use of high-risk medication  - COMPLETE METABOLIC PANEL WITH GFR  9. Breast cancer screening by mammogram  - MM 3D SCREEN BREAST BILATERAL; Future   -USPSTF grade A and B recommendations reviewed  with patient; age-appropriate recommendations, preventive care, screening tests, etc discussed and encouraged; healthy living encouraged; see AVS for patient education given to patient -Discussed importance of 150 minutes of physical activity weekly, eat two servings of fish weekly, eat one serving of tree nuts ( cashews, pistachios, pecans, almonds.Marland Kitchen) every other day, eat 6 servings of fruit/vegetables daily and drink plenty of water and avoid sweet beverages.   -Reviewed Health Maintenance: Yes.

## 2022-01-03 LAB — COMPLETE METABOLIC PANEL WITH GFR
AG Ratio: 1.8 (calc) (ref 1.0–2.5)
ALT: 21 U/L (ref 6–29)
AST: 19 U/L (ref 10–35)
Albumin: 4.4 g/dL (ref 3.6–5.1)
Alkaline phosphatase (APISO): 71 U/L (ref 37–153)
BUN: 10 mg/dL (ref 7–25)
CO2: 28 mmol/L (ref 20–32)
Calcium: 9.5 mg/dL (ref 8.6–10.4)
Chloride: 106 mmol/L (ref 98–110)
Creat: 0.89 mg/dL (ref 0.50–1.03)
Globulin: 2.5 g/dL (calc) (ref 1.9–3.7)
Glucose, Bld: 80 mg/dL (ref 65–99)
Potassium: 3.9 mmol/L (ref 3.5–5.3)
Sodium: 141 mmol/L (ref 135–146)
Total Bilirubin: 0.3 mg/dL (ref 0.2–1.2)
Total Protein: 6.9 g/dL (ref 6.1–8.1)
eGFR: 79 mL/min/{1.73_m2} (ref >=60)

## 2022-01-03 LAB — LIPID PANEL
Cholesterol: 205 mg/dL — ABNORMAL HIGH (ref <200)
HDL: 65 mg/dL (ref >=50)
LDL Cholesterol (Calc): 120 mg/dL (calc) — ABNORMAL HIGH
Non-HDL Cholesterol (Calc): 140 mg/dL (calc) — ABNORMAL HIGH (ref <130)
Total CHOL/HDL Ratio: 3.2 (calc) (ref <5.0)
Triglycerides: 98 mg/dL (ref <150)

## 2022-01-03 LAB — HEMOGLOBIN A1C
Hgb A1c MFr Bld: 5.5 % of total Hgb (ref <5.7)
Mean Plasma Glucose: 111 mg/dL
eAG (mmol/L): 6.2 mmol/L

## 2022-01-03 LAB — TSH: TSH: 1.55 mIU/L

## 2022-03-21 IMAGING — MG DIGITAL SCREENING BREAST BILAT IMPLANT W/ TOMO W/ CAD
8 of 14 series · 8 of 34 positions shown · non-contrast
Comparison: Previous exam(s).

CLINICAL DATA: Screening.

EXAM:
DIGITAL SCREENING BILATERAL MAMMOGRAM WITH IMPLANTS, CAD AND TOMO
The patient has retropectoral implants. Standard and implant
displaced views were performed.

[L MLO]
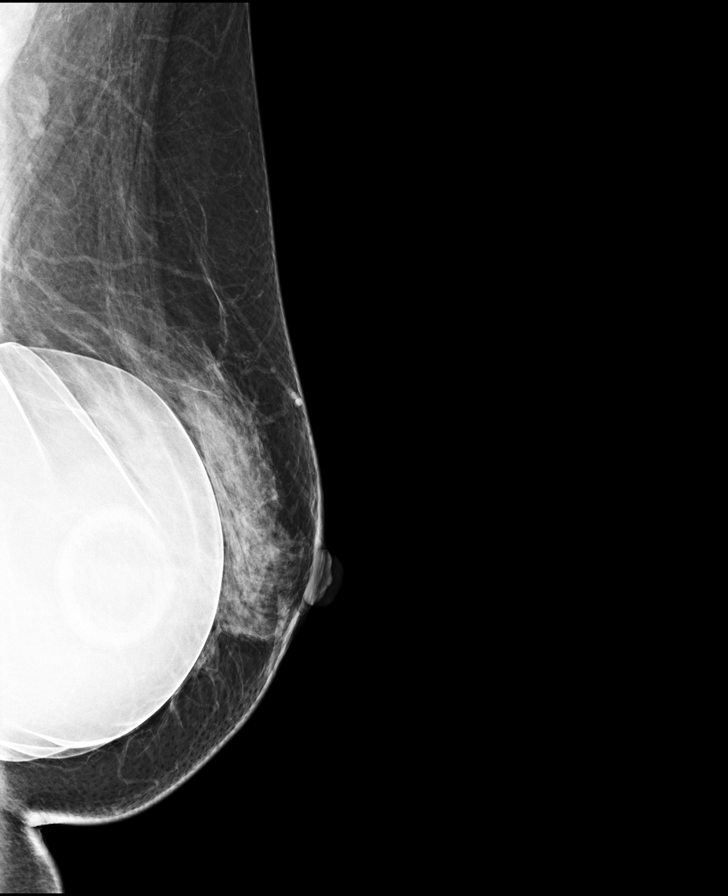

[R CC]
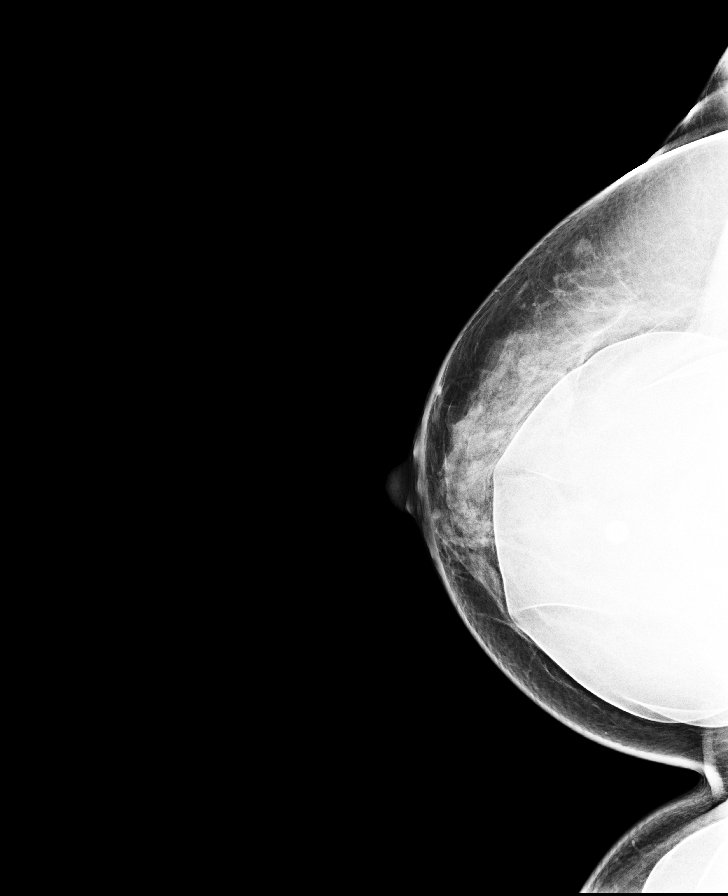

[L CC]
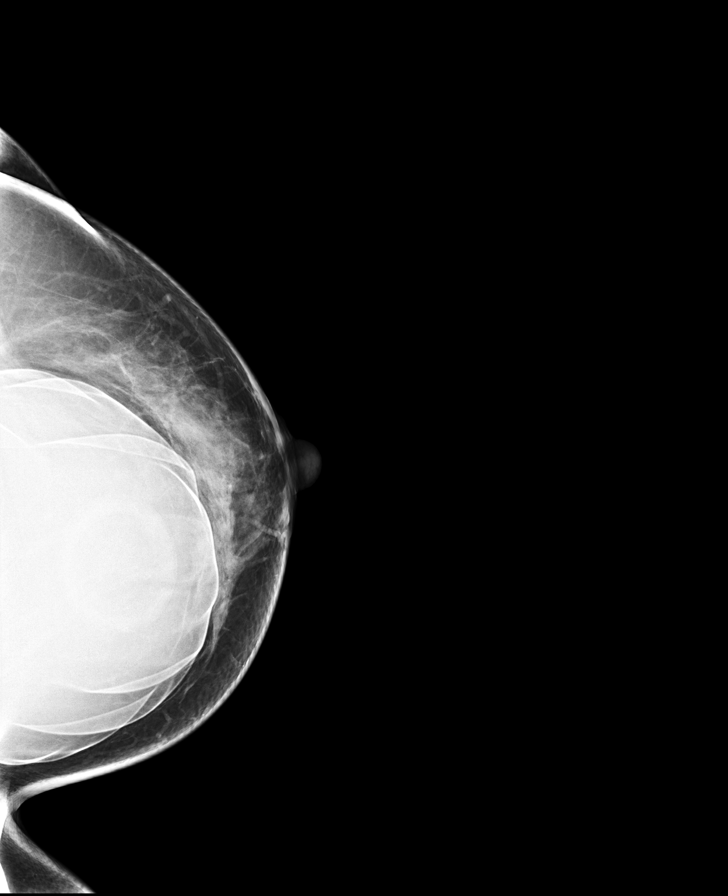

[R MLO]
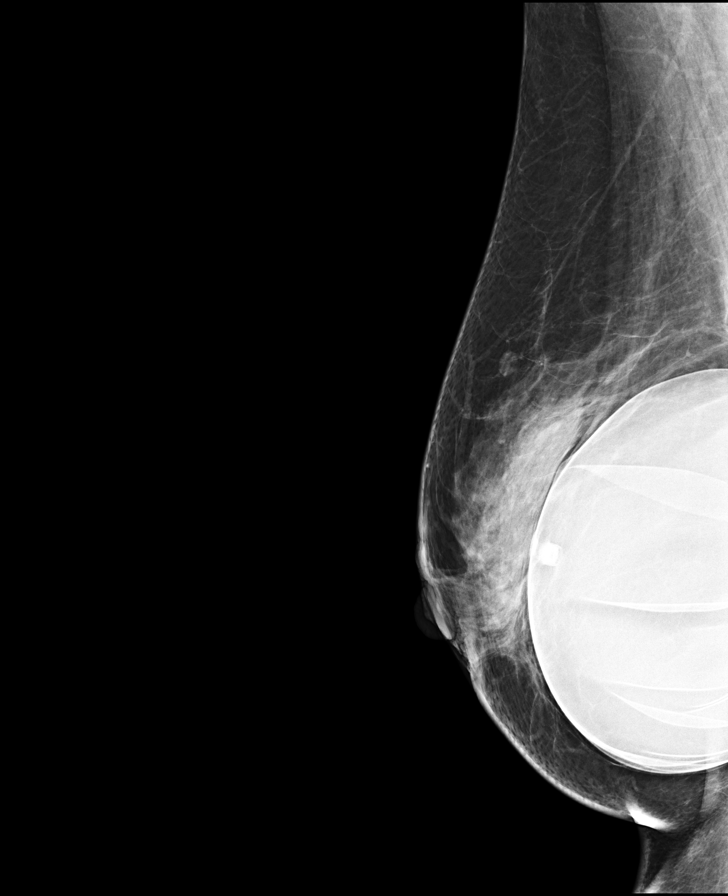

[R CC synth-2D]
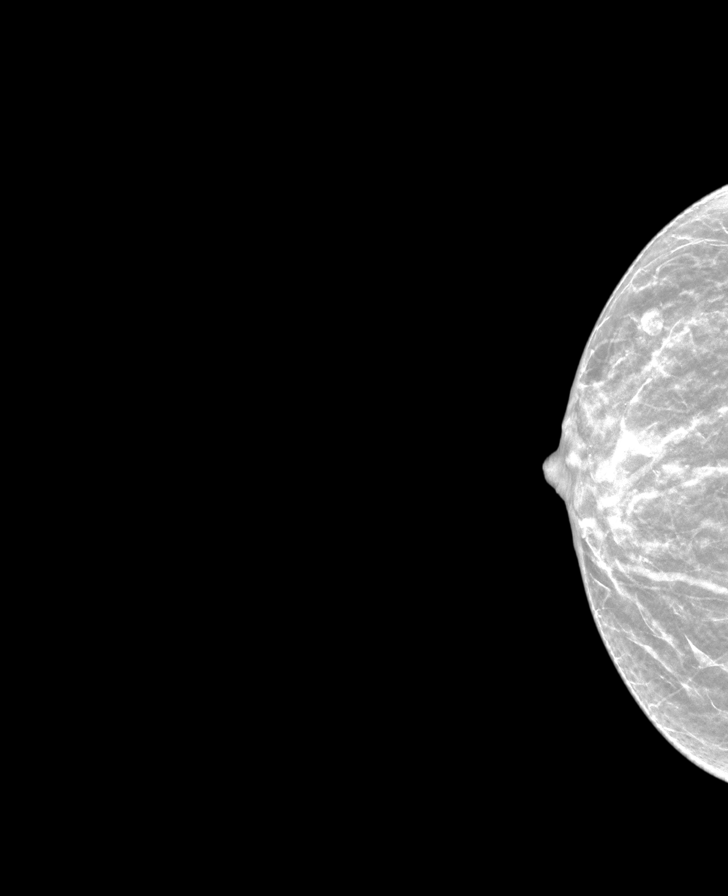

[L MLO synth-2D (1 of 2)]
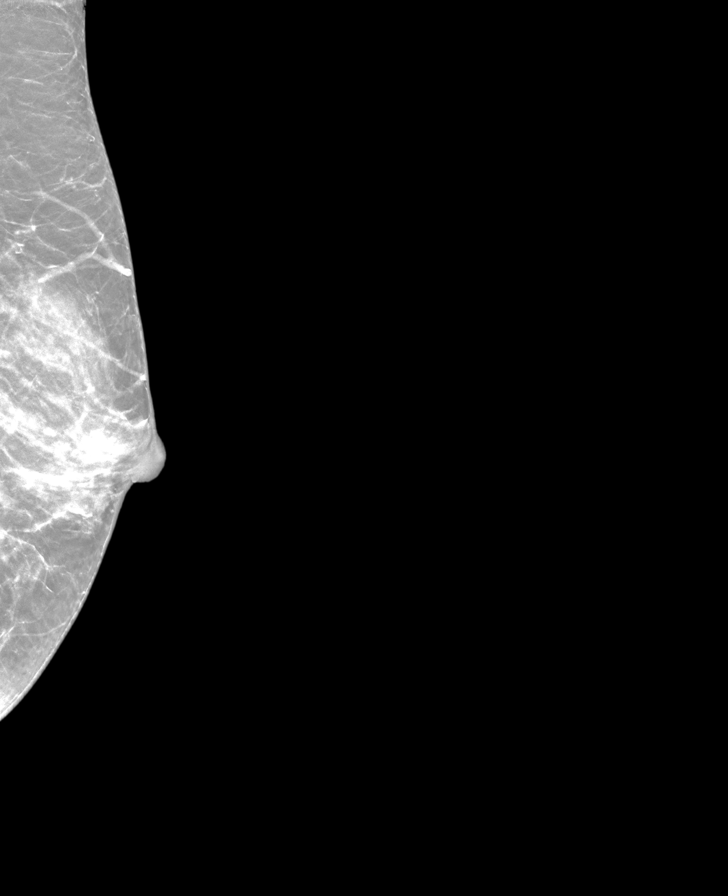

[R MLO synth-2D]
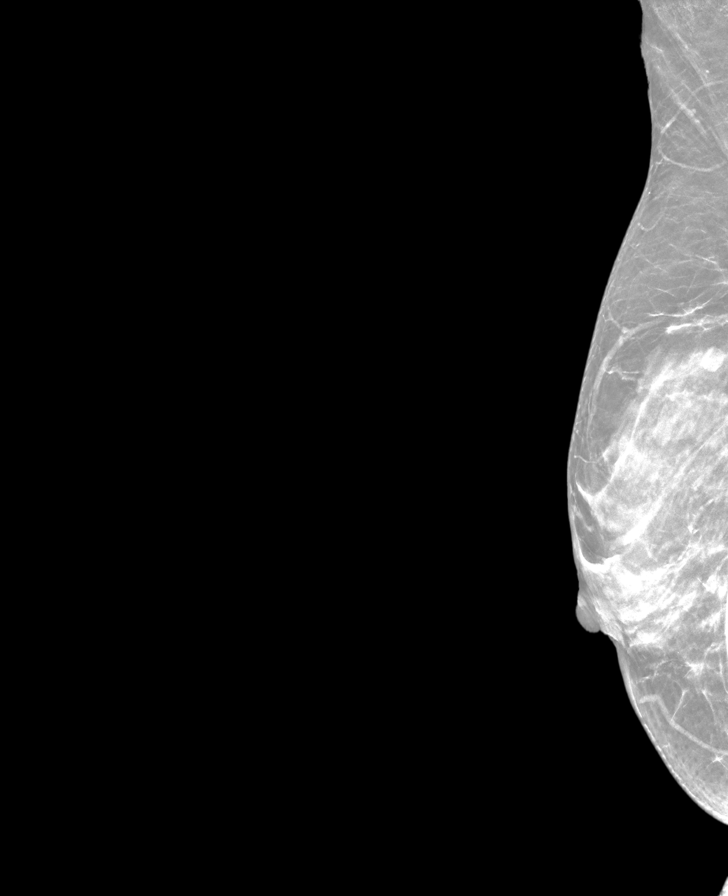

[L MLO synth-2D (2 of 2)]
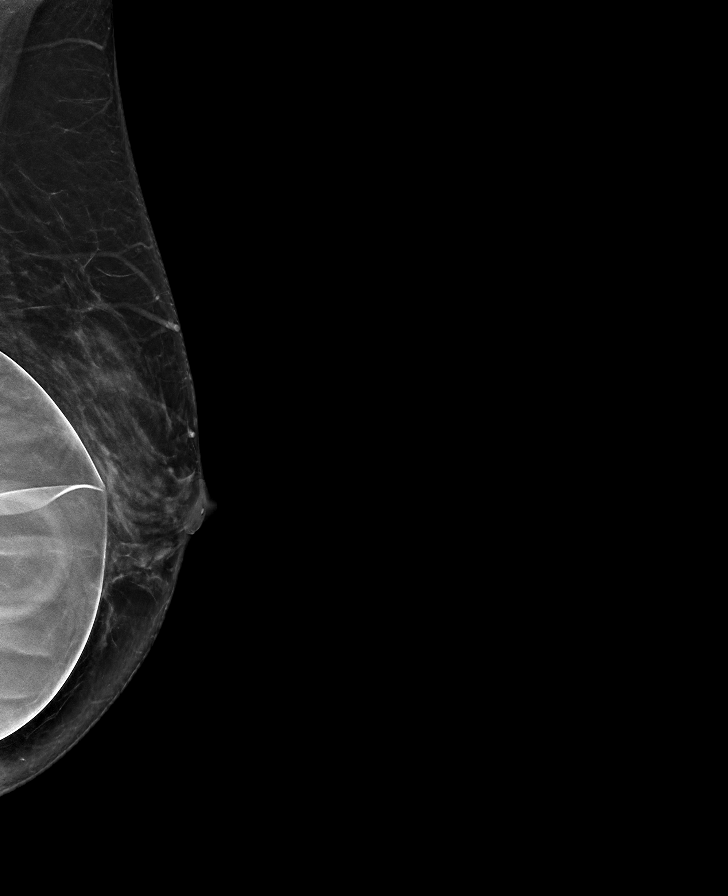

[8 of 34 positions shown; findings below may reference images not displayed]

ACR Breast Density Category c: The breast tissue is heterogeneously
dense, which may obscure small masses.
FINDINGS: There are no findings suspicious for malignancy. Images were
processed with CAD.
IMPRESSION: No mammographic evidence of malignancy. A result letter of this
screening mammogram will be mailed directly to the patient.

RECOMMENDATION:
Screening mammogram in one year. (Code:49-X-OQ9)

BI-RADS CATEGORY  1:  Negative.

## 2022-07-30 ENCOUNTER — Other Ambulatory Visit: Payer: Self-pay | Admitting: Family Medicine

## 2022-07-30 DIAGNOSIS — Z1231 Encounter for screening mammogram for malignant neoplasm of breast: Secondary | ICD-10-CM

## 2022-08-02 ENCOUNTER — Ambulatory Visit
Admission: RE | Admit: 2022-08-02 | Discharge: 2022-08-02 | Disposition: A | Discharge status: Home / Self Care | Payer: BC Managed Care – PPO | Source: Ambulatory Visit | Attending: Family Medicine | Admitting: Family Medicine

## 2022-08-02 DIAGNOSIS — Z1231 Encounter for screening mammogram for malignant neoplasm of breast: Secondary | ICD-10-CM | POA: Insufficient documentation

## 2022-09-26 IMAGING — CR DG CHEST 2V
1 series · 2 of 2 positions shown · non-contrast
Comparison: None.

CLINICAL DATA: Chest pain

EXAM:
CHEST - 2 VIEW

[Series 1: dg chest 2 view · 0.14mm/px · 2 of 2 slices shown]
[im 1/2]
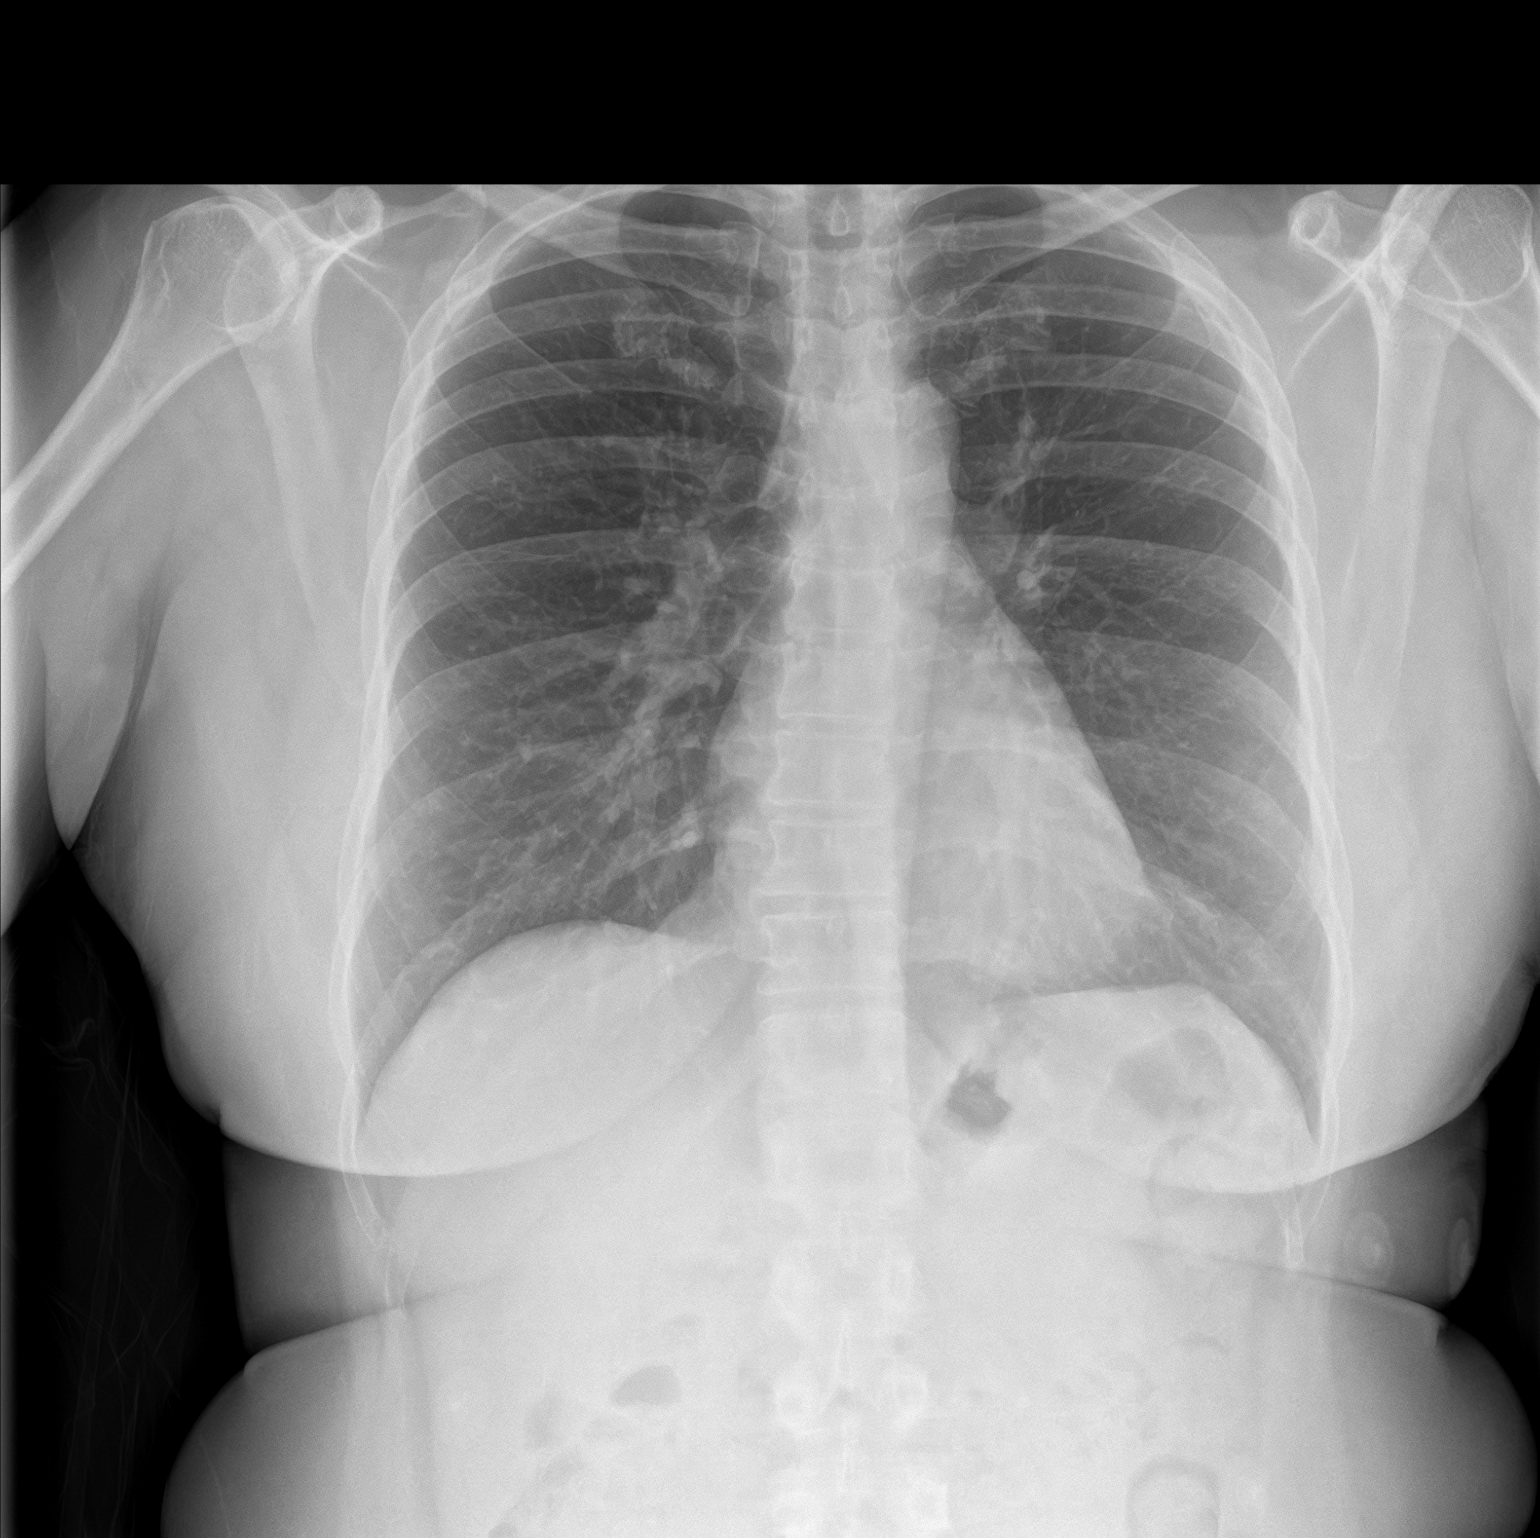
[im 2/2]
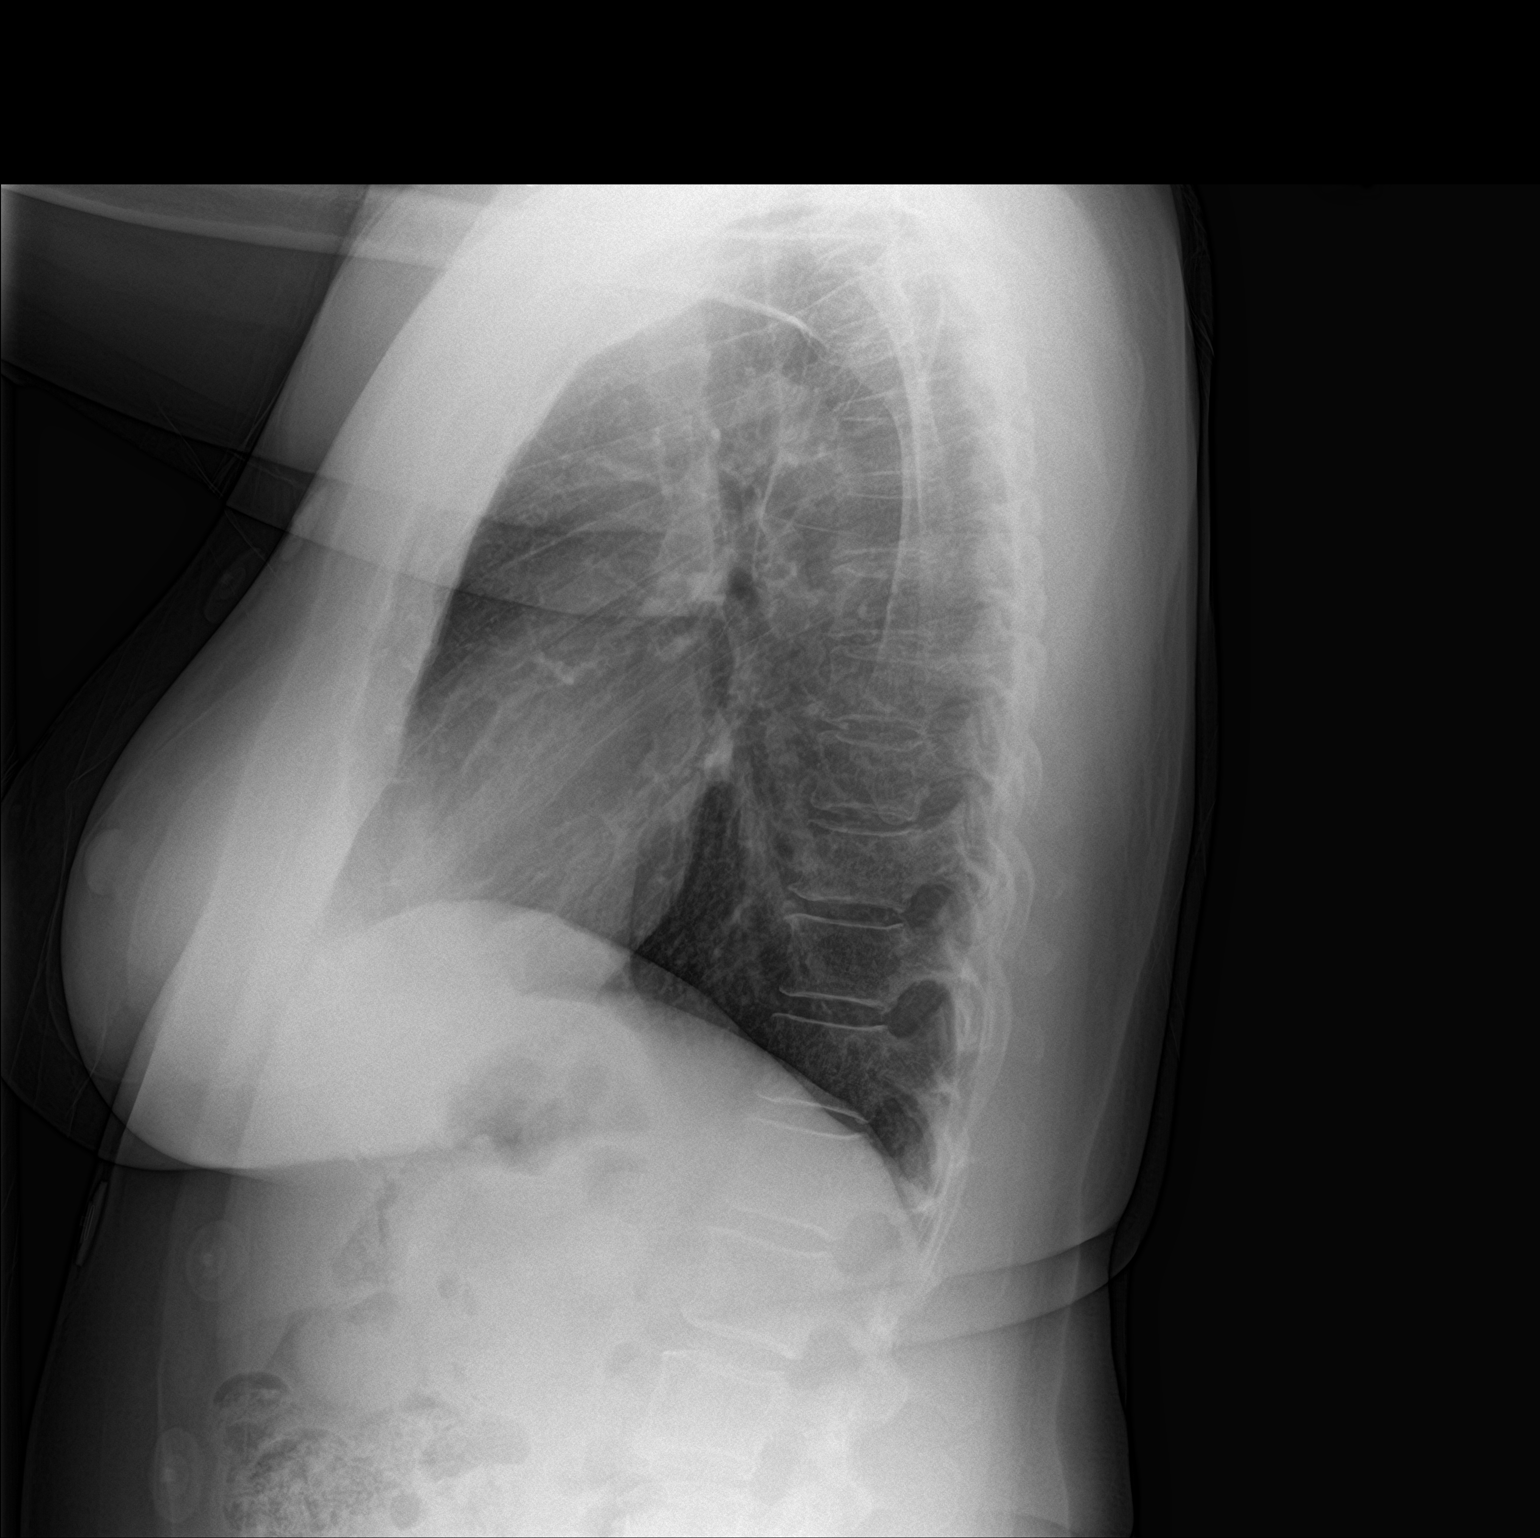

[2 of 2 positions shown; findings below may reference images not displayed]

FINDINGS: No consolidation, features of edema, pneumothorax, or effusion.
Pulmonary vascularity is normally distributed. The cardiomediastinal
contours are unremarkable. No acute osseous or soft tissue
abnormality.
IMPRESSION: No acute cardiopulmonary abnormality.

## 2022-11-12 ENCOUNTER — Ambulatory Visit: Payer: BC Managed Care – PPO | Admitting: Family Medicine

## 2022-11-12 ENCOUNTER — Encounter: Payer: Self-pay | Admitting: Family Medicine

## 2022-11-12 ENCOUNTER — Ambulatory Visit: Payer: Self-pay

## 2022-11-12 VITALS — BP 124/70 | HR 91 | Resp 16 | Ht 62.0 in | Wt 180.0 lb

## 2022-11-12 DIAGNOSIS — M545 Low back pain, unspecified: Secondary | ICD-10-CM | POA: Diagnosis not present

## 2022-11-12 DIAGNOSIS — G8929 Other chronic pain: Secondary | ICD-10-CM

## 2022-11-12 DIAGNOSIS — M069 Rheumatoid arthritis, unspecified: Secondary | ICD-10-CM | POA: Diagnosis not present

## 2022-11-12 DIAGNOSIS — R232 Flushing: Secondary | ICD-10-CM

## 2022-11-12 DIAGNOSIS — Z23 Encounter for immunization: Secondary | ICD-10-CM

## 2022-11-12 MED ORDER — METAXALONE 800 MG PO TABS: 800.0000 mg | ORAL_TABLET | Freq: Three times a day (TID) | ORAL | 0 refills | Status: DC | PRN

## 2022-11-12 MED ORDER — CELECOXIB 100 MG PO CAPS: 100.0000 mg | ORAL_CAPSULE | Freq: Two times a day (BID) | ORAL | 0 refills | Status: AC

## 2022-11-12 NOTE — Telephone Encounter (Signed)
  Chief Complaint: lower back pain for 1.5 months but worsening Symptoms: moderate to severe pain Frequency: 1.5 months Pertinent Negatives: Patient denies fever, abdomen pain, burning with urination, blood in urine Disposition: '[]'$ ED /'[]'$ Urgent Care (no appt availability in office) / '[x]'$ Appointment(In office/virtual)/ '[]'$  [facility]/ '[]'$ Home Care/ '[]'$ Refused Recommended Disposition /'[]'$ [facility]/ '[]'$  Follow-up with PCP Additional Notes: appt this am with PCP Reason for Disposition  [1] SEVERE back pain (e.g., excruciating, unable to do any normal activities) AND [2] not improved 2 hours after pain medicine  Answer Assessment - Initial Assessment Questions 1. ONSET: "When did the pain begin?"      Chronic 1.5 months  2. LOCATION: "Where does it hurt?" (upper, mid or lower back)     Lower back feel like in bone 3. SEVERITY: "How bad is the pain?"  (e.g., Scale 1-10; mild, moderate, or severe)   - MILD (1-3): Doesn't interfere with normal activities.    - MODERATE (4-7): Interferes with normal activities or awakens from sleep.    - SEVERE (8-10): Excruciating pain, unable to do any normal activities.      Moderate to severe pain varies 4. PATTERN: "Is the pain constant?" (e.g., yes, no; constant, intermittent)      constant 5. RADIATION: "Does the pain shoot into your legs or somewhere else?"     no 6. CAUSE:  "What do you think is causing the back pain?"      Arthritis  7. BACK OVERUSE:  "Any recent lifting of heavy objects, strenuous work or exercise?"     no 8. MEDICINES: "What have you taken so far for the pain?" (e.g., nothing, acetaminophen, NSAIDS)     Aleve 9. NEUROLOGIC SYMPTOMS: "Do you have any weakness, numbness, or problems with bowel/bladder control?"     no 10. OTHER SYMPTOMS: "Do you have any other symptoms?" (e.g., fever, abdomen pain, burning with urination, blood in urine)       no 11. PREGNANCY: "Is there any chance you are pregnant?" "When was  your last menstrual period?"       N/a  Protocols used: Back Pain-A-AH

## 2022-11-12 NOTE — Progress Notes (Signed)
Name: Rita Martinez   MRN: [id number]    DOB: 08-02-1971   Date:11/12/2022       Progress Note  Subjective  Chief Complaint  Back Pain  HPI  Chronic back pain: she states that symptoms present for a long time, but over the past 6 weeks it has been constant, usually only triggered by activity like standing or walking for a long time. She states the pain is dull and aching, constant and can go from 5-10, worse at night. No radiation. No bladder or bowel incontinence. No rashes No trauma that could have cause the flare. She has RA and sees Dr. Posey Pronto, last flare was May 2023 and she had x-ray done in his office and per his note he recommended daily home exercises and reassurance given. At that time she was having radiculitis down left leg. She has been taking Aleve three otc tablets per day and it helps with symptoms.   RA: under the care of Dr. Posey Pronto and is doing well   GERD: doing well at this time, taking PPI prn only for heartburn or indigestion   Hot flashes: taking paxil given by her gyn and seems to help with symptoms   Patient Active Problem List   Diagnosis Date Noted   Pelvic pain in female 10/08/2021   Primary osteoarthritis of both knees 02/15/2021   Psoriasis of scalp 10/22/2018   Chest pain 09/24/2018   Class 1 obesity due to excess calories without serious comorbidity with body mass index (BMI) of 31.0 to 31.9 in adult 09/24/2018   Rheumatoid arthritis ([facility]) 11/06/2011   Gastro-esophageal reflux disease without esophagitis 02/14/2011   Anxiety state 08/26/2007   Vitamin D deficiency 08/26/2007    Past Surgical History:  Procedure Laterality Date   AUGMENTATION MAMMAPLASTY Bilateral 1990's   saline   BREAST ENHANCEMENT SURGERY     BREAST LUMPECTOMY Left 1990's   benign   COLONOSCOPY WITH PROPOFOL N/A 02/23/2021   Procedure: COLONOSCOPY WITH PROPOFOL;  Surgeon: Jonathon Bellows, MD;  Location: [facility] ENDOSCOPY;  Service: Gastroenterology;  Laterality: N/A;   TONSILLECTOMY       Family History  Problem Relation Age of Onset   Breast cancer Mother 38   High Cholesterol Mother    Multiple myeloma Mother    Prostate cancer Father    High Cholesterol Father    Hypertension Father     Social History   Tobacco Use   Smoking status: Never   Smokeless tobacco: Never  Substance Use Topics   Alcohol use: Never     Current Outpatient Medications:    HUMIRA PEN 40 MG/0.4ML PNKT, SMARTSIG:40 Milligram(s) SUB-Q Every 2 Weeks, Disp: , Rfl:    PARoxetine (PAXIL) 20 MG tablet, Take 20 mg by mouth daily., Disp: , Rfl:    esomeprazole (NEXIUM) 40 MG capsule, TAKE 1 CAPSULE (40 MG TOTAL) BY MOUTH DAILY. (Patient not taking: Reported on 11/12/2022), Disp: 90 capsule, Rfl: 1  No Known Allergies  I personally reviewed active problem list, medication list, allergies, family history, social history, health maintenance with the patient/caregiver today.   ROS  Ten systems reviewed and is negative except as mentioned in HPI   Objective  Vitals:   11/12/22 1034  BP: 124/70  Pulse: 91  Resp: 16  SpO2: 98%  Weight: 180 lb (81.6 kg)  Height: '5\' 2"'$  (1.575 m)    Body mass index is 32.92 kg/m.  Physical Exam  Constitutional: Patient appears well-developed and well-nourished. Obese  No distress.  HEENT: head atraumatic, normocephalic, pupils equal and reactive to light, neck supple Cardiovascular: Normal rate, regular rhythm and normal heart sounds.  No murmur heard. No BLE edema. Pulmonary/Chest: Effort normal and breath sounds normal. No respiratory distress. Abdominal: Soft.  There is no tenderness. Muscular skeletal: lordosis of lumbar spine, pain with extension, negative straight leg raise, normal gait Psychiatric: Patient has a normal mood and affect. behavior is normal. Judgment and thought content normal.    PHQ2/9:    11/12/2022   10:33 AM 01/02/2022    3:41 PM 10/08/2021   11:05 AM 01/01/2021    4:11 PM 10/22/2018    9:44 AM  Depression screen PHQ 2/9   Decreased Interest 0 0 0 0 0  Down, Depressed, Hopeless 0 0 0 0 0  PHQ - 2 Score 0 0 0 0 0  Altered sleeping 3 0 0 0 0  Tired, decreased energy 0 0 0 0 0  Change in appetite 0 0 0 0 0  Feeling bad or failure about yourself  0 0 0 0 0  Trouble concentrating 0 0 0 0 0  Moving slowly or fidgety/restless 0 0 0 0 0  Suicidal thoughts 0 0 0 0 0  PHQ-9 Score 3 0 0 0 0  Difficult doing work/chores  Not difficult at all Not difficult at all  Not difficult at all    phq 9 is negative   Fall Risk:    11/12/2022   10:33 AM 01/02/2022    3:41 PM 10/08/2021   11:05 AM 01/01/2021    4:11 PM 10/22/2018    9:44 AM  Fall Risk   Falls in the past year? 0 0 1 0 0  Number falls in past yr: 0 0 1 0 0  Injury with Fall? 0 0  0 0  Risk for fall due to : No Fall Risks No Fall Risks History of fall(s)    Follow up Falls prevention discussed Falls prevention discussed Falls evaluation completed        Functional Status Survey: Is the patient deaf or have difficulty hearing?: No Does the patient have difficulty seeing, even when wearing glasses/contacts?: No Does the patient have difficulty concentrating, remembering, or making decisions?: No Does the patient have difficulty walking or climbing stairs?: No Does the patient have difficulty dressing or bathing?: No Does the patient have difficulty doing errands alone such as visiting a doctor's office or shopping?: No    Assessment & Plan  1. Chronic midline low back pain without sciatica  - celecoxib (CELEBREX) 100 MG capsule; Take 1 capsule (100 mg total) by mouth 2 (two) times daily.  Dispense: 180 capsule; Refill: 0 - metaxalone (SKELAXIN) 800 MG tablet; Take 1 tablet (800 mg total) by mouth 3 (three) times daily as needed for muscle spasms.  Dispense: 90 tablet; Refill: 0  Discussed referral to PT or to resume home exercise and she chose the later   2. Need for immunization against influenza  - Flu Vaccine QUAD 6+ mos PF IM (Fluarix Quad  PF)  3. Rheumatoid arthritis, involving unspecified site, unspecified whether rheumatoid factor present ([place])  Doing well   4. Hot flashes  Explained to her we can switch from paxil to duloxetine to help with pain

## 2022-12-18 ENCOUNTER — Encounter: Payer: Self-pay | Admitting: Family Medicine

## 2022-12-19 ENCOUNTER — Other Ambulatory Visit: Payer: Self-pay | Admitting: Family Medicine

## 2022-12-19 DIAGNOSIS — R232 Flushing: Secondary | ICD-10-CM

## 2022-12-19 DIAGNOSIS — G8929 Other chronic pain: Secondary | ICD-10-CM

## 2022-12-19 MED ORDER — DULOXETINE HCL 30 MG PO CPEP: 30.0000 mg | ORAL_CAPSULE | Freq: Every day | ORAL | 0 refills | Status: DC

## 2023-01-05 ENCOUNTER — Other Ambulatory Visit: Payer: Self-pay | Admitting: Family Medicine

## 2023-01-05 MED ORDER — DULOXETINE HCL 60 MG PO CPEP: 60.0000 mg | ORAL_CAPSULE | Freq: Every day | ORAL | 0 refills | Status: DC

## 2023-01-10 ENCOUNTER — Encounter: Payer: BC Managed Care – PPO | Admitting: Family Medicine

## 2023-01-15 NOTE — Progress Notes (Unsigned)
Name: Rita Martinez   MRN: [id number]    DOB: [birth year]   Date:01/16/2023       Progress Note  Subjective  Chief Complaint  Annual Exam  HPI  Patient presents for annual CPE.  Diet:she avoids fried food, it is rich in lean meat and vegetables, she likes fruit, avoids carbohydrates  Exercise:  only walking at work only, discussed 150 minutes per week Last Eye Exam: up to date  Last Dental Exam: up to date   [employer] Visit from 01/16/2023 in [facility]  AUDIT-C Score 1      Depression: Phq 9 is  negative    01/16/2023    8:37 AM 11/12/2022   10:33 AM 01/02/2022    3:41 PM 10/08/2021   11:05 AM 01/01/2021    4:11 PM  Depression screen PHQ 2/9  Decreased Interest 0 0 0 0 0  Down, Depressed, Hopeless 0 0 0 0 0  PHQ - 2 Score 0 0 0 0 0  Altered sleeping 0 3 0 0 0  Tired, decreased energy 0 0 0 0 0  Change in appetite 0 0 0 0 0  Feeling bad or failure about yourself  0 0 0 0 0  Trouble concentrating 0 0 0 0 0  Moving slowly or fidgety/restless 0 0 0 0 0  Suicidal thoughts 0 0 0 0 0  PHQ-9 Score 0 3 0 0 0  Difficult doing work/chores   Not difficult at all Not difficult at all    Hypertension: BP Readings from Last 3 Encounters:  01/16/23 118/70  11/12/22 124/70  01/02/22 128/80   Obesity: Wt Readings from Last 3 Encounters:  01/16/23 175 lb 11.2 oz (79.7 kg)  11/12/22 180 lb (81.6 kg)  01/02/22 179 lb 1.6 oz (81.2 kg)   BMI Readings from Last 3 Encounters:  01/16/23 32.14 kg/m  11/12/22 32.92 kg/m  01/02/22 32.76 kg/m     Vaccines:   Tdap: up to date Shingrix: today  Pneumonia: up to date  Flu: up to date COVID-19: she had two shots    Hep C Screening: 09/24/18 STD testing and prevention (HIV/chl/gon/syphilis): 09/24/18 Intimate partner violence: negative screen  Sexual History : one partner for the past 6 years, no pain  Menstrual History/LMP/Abnormal Bleeding: s/p hysterectomy  Discussed importance of follow  up if any post-menopausal bleeding: N/A Incontinence Symptoms: negative for symptoms   Breast cancer:  - Last Mammogram: 08/02/22 - BRCA gene screening: N/A  Osteoporosis Prevention : Discussed high calcium and vitamin D supplementation, weight bearing exercises Bone density: N/A   Cervical cancer screening: done by gyn, last one in 2019 and she is going back soon - she had a supra cervical hysterectomy   Skin cancer: Discussed monitoring for atypical lesions  Colorectal cancer: 02/23/21   Lung cancer:  Low Dose CT Chest recommended if Age 52-80 years, 20 pack-year currently smoking OR have quit w/in 15years. Patient does not qualify for screen   ECG: 11/14/20  Advanced Care Planning: A voluntary discussion about advance care planning including the explanation and discussion of advance directives.  Discussed health care proxy and Living will, and the patient was able to identify a health care proxy as daughter .  Patient does not have a living will and power of attorney of health care   Lipids: Lab Results  Component Value Date   CHOL 205 (H) 01/02/2022   CHOL 188 01/01/2021   CHOL 157 09/24/2018  Lab Results  Component Value Date   HDL 65 01/02/2022   HDL 61 01/01/2021   HDL 70 09/24/2018   Lab Results  Component Value Date   LDLCALC 120 (H) 01/02/2022   LDLCALC 108 (H) 01/01/2021   LDLCALC 73 09/24/2018   Lab Results  Component Value Date   TRIG 98 01/02/2022   TRIG 99 01/01/2021   TRIG 59 09/24/2018   Lab Results  Component Value Date   CHOLHDL 3.2 01/02/2022   CHOLHDL 3.1 01/01/2021   CHOLHDL 2.2 09/24/2018   No results found for: "LDLDIRECT"  Glucose: Glucose, Bld  Date Value Ref Range Status  01/02/2022 80 65 - 99 mg/dL Final    Comment:    .            Fasting reference interval .   11/14/2020 95 70 - 99 mg/dL Final    Comment:    Glucose reference range applies only to samples taken after fasting for at least 8 hours.  09/24/2018 75 65 - 139 mg/dL  Final    Comment:    .        Non-fasting reference interval .     Patient Active Problem List   Diagnosis Date Noted   Pelvic pain in female 10/08/2021   Primary osteoarthritis of both knees 02/15/2021   Psoriasis of scalp 10/22/2018   Chest pain 09/24/2018   Class 1 obesity due to excess calories without serious comorbidity with body mass index (BMI) of 31.0 to 31.9 in adult 09/24/2018   Rheumatoid arthritis ([place]) 11/06/2011   Gastro-esophageal reflux disease without esophagitis 02/14/2011   Anxiety state 08/26/2007   Vitamin D deficiency 08/26/2007    Past Surgical History:  Procedure Laterality Date   AUGMENTATION MAMMAPLASTY Bilateral 1990's   saline   BREAST ENHANCEMENT SURGERY     BREAST LUMPECTOMY Left 1990's   benign   COLONOSCOPY WITH PROPOFOL N/A 02/23/2021   Procedure: COLONOSCOPY WITH PROPOFOL;  Surgeon: Jonathon Bellows, MD;  Location: [facility] ENDOSCOPY;  Service: Gastroenterology;  Laterality: N/A;   TONSILLECTOMY      Family History  Problem Relation Age of Onset   Breast cancer Mother 62   High Cholesterol Mother    Multiple myeloma Mother    Prostate cancer Father    High Cholesterol Father    Hypertension Father    Asthma Daughter     Social History   Socioeconomic History   Marital status: Single    Spouse name: Not on file   Number of children: 2   Years of education: Not on file   Highest education level: Not on file  Occupational History   Not on file  Tobacco Use   Smoking status: Never   Smokeless tobacco: Never  Vaping Use   Vaping Use: Never used  Substance and Sexual Activity   Alcohol use: Never   Drug use: Never   Sexual activity: Yes    Partners: Male    Birth control/protection: Surgical  Other Topics Concern   Not on file  Social History Narrative   Lives with her son Dallie Dad Carver Fila.    Social Determinants of Health   Financial Resource Strain: Low Risk  (01/16/2023)   Overall Financial Resource Strain (CARDIA)     Difficulty of Paying Living Expenses: Not hard at all  Food Insecurity: No Food Insecurity (01/16/2023)   Hunger Vital Sign    Worried About Running Out of Food in the Last Year: Never true    Ran  Out of Food in the Last Year: Never true  Transportation Needs: No Transportation Needs (01/16/2023)   PRAPARE - Hydrologist (Medical): No    Lack of Transportation (Non-Medical): No  Physical Activity: Insufficiently Active (01/16/2023)   Exercise Vital Sign    Days of Exercise per Week: 2 days    Minutes of Exercise per Session: 30 min  Stress: No Stress Concern Present (01/16/2023)   [place]    Feeling of Stress : Only a little  Social Connections: Unknown (01/16/2023)   Social Connection and Isolation Panel [NHANES]    Frequency of Communication with Friends and Family: More than three times a week    Frequency of Social Gatherings with Friends and Family: Once a week    Attends Religious Services: Patient declined    Marine scientist or Organizations: Patient declined    Attends Archivist Meetings: Patient declined    Marital Status: Divorced  Human resources officer Violence: Not At Risk (01/16/2023)   Humiliation, Afraid, Rape, and Kick questionnaire    Fear of Current or Ex-Partner: No    Emotionally Abused: No    Physically Abused: No    Sexually Abused: No     Current Outpatient Medications:    celecoxib (CELEBREX) 100 MG capsule, Take 1 capsule (100 mg total) by mouth 2 (two) times daily., Disp: 180 capsule, Rfl: 0   DULoxetine (CYMBALTA) 60 MG capsule, Take 1 capsule (60 mg total) by mouth daily., Disp: 30 capsule, Rfl: 0   esomeprazole (NEXIUM) 40 MG capsule, TAKE 1 CAPSULE (40 MG TOTAL) BY MOUTH DAILY., Disp: 90 capsule, Rfl: 1   HUMIRA PEN 40 MG/0.4ML PNKT, SMARTSIG:40 Milligram(s) SUB-Q Every 2 Weeks, Disp: , Rfl:    metaxalone (SKELAXIN) 800 MG tablet, Take 1 tablet  (800 mg total) by mouth 3 (three) times daily as needed for muscle spasms., Disp: 90 tablet, Rfl: 0   Zoster Vaccine Adjuvanted (SHINGRIX) injection, Inject 0.5 mLs into the muscle once for 1 dose., Disp: 0.5 mL, Rfl: 0  No Known Allergies   ROS  Constitutional: Negative for fever or weight change.  Respiratory: Negative for cough and shortness of breath.   Cardiovascular: Negative for chest pain or palpitations.  Gastrointestinal: Negative for abdominal pain, no bowel changes.  Musculoskeletal: Negative for gait problem or joint swelling.  Skin: Negative for rash.  Neurological: Negative for dizziness or headache.  No other specific complaints in a complete review of systems (except as listed in HPI above).   Objective  Vitals:   01/16/23 0828  BP: 118/70  Pulse: 90  Resp: 16  Temp: 97.8 F (36.6 C)  TempSrc: Oral  SpO2: 98%  Weight: 175 lb 11.2 oz (79.7 kg)  Height: '5\' 2"'$  (1.575 m)    Body mass index is 32.14 kg/m.  Physical Exam  Constitutional: Patient appears well-developed and well-nourished. Obese  No distress.  HENT: Head: Normocephalic and atraumatic. Ears: B TMs ok, no erythema or effusion; Nose: Nose normal. Mouth/Throat: Oropharynx is clear and moist. No oropharyngeal exudate.  Eyes: Conjunctivae and EOM are normal. Pupils are equal, round, and reactive to light. No scleral icterus.  Neck: Normal range of motion. Neck supple. No JVD present. No thyromegaly present.  Cardiovascular: Normal rate, regular rhythm and normal heart sounds.  No murmur heard. No BLE edema. Pulmonary/Chest: Effort normal and breath sounds normal. No respiratory distress. Abdominal: Soft. Bowel sounds are normal, no distension.  There is no tenderness. no masses Breast: no lumps or masses, no nipple discharge or rashes FEMALE GENITALIA:  Not done - seeing gyn next month  RECTAL: not done Musculoskeletal: Normal range of motion, no joint effusions. No gross deformities Neurological:  he is alert and oriented to person, place, and time. No cranial nerve deficit. Coordination, balance, strength, speech and gait are normal.  Skin: Skin is warm and dry. No rash noted. No erythema.  Psychiatric: Patient has a normal mood and affect. behavior is normal. Judgment and thought content normal.   Fall Risk:    01/16/2023    8:38 AM 11/12/2022   10:33 AM 01/02/2022    3:41 PM 10/08/2021   11:05 AM 01/01/2021    4:11 PM  Fall Risk   Falls in the past year? 0 0 0 1 0  Number falls in past yr:  0 0 1 0  Injury with Fall?  0 0  0  Risk for fall due to : No Fall Risks No Fall Risks No Fall Risks History of fall(s)   Follow up Falls prevention discussed;Education provided;Falls evaluation completed Falls prevention discussed Falls prevention discussed Falls evaluation completed      Functional Status Survey: Is the patient deaf or have difficulty hearing?: No Does the patient have difficulty seeing, even when wearing glasses/contacts?: No Does the patient have difficulty concentrating, remembering, or making decisions?: No Does the patient have difficulty walking or climbing stairs?: No Does the patient have difficulty dressing or bathing?: No Does the patient have difficulty doing errands alone such as visiting a doctor's office or shopping?: No   Assessment & Plan  1. Well adult exam  - Lipid panel - COMPLETE METABOLIC PANEL WITH GFR - Hemoglobin A1c - TSH - Zoster Vaccine Adjuvanted [facility]) injection; Inject 0.5 mLs into the muscle once for 1 dose.  Dispense: 0.5 mL; Refill: 0  2. Lipid screening  - Lipid panel  3. Diabetes mellitus screening  - Hemoglobin A1c  4. Family history of thyroid disease  - TSH  5. Long-term use of high-risk medication  - COMPLETE METABOLIC PANEL WITH GFR  6. Need for shingles vaccine  - Zoster Vaccine Adjuvanted [facility]) injection; Inject 0.5 mLs into the muscle once for 1 dose.  Dispense: 0.5 mL; Refill: 0    7. Breast  cancer screening by mammogram  - MM 3D SCREENING MAMMOGRAM BILATERAL BREAST; Future    -USPSTF grade A and B recommendations reviewed with patient; age-appropriate recommendations, preventive care, screening tests, etc discussed and encouraged; healthy living encouraged; see AVS for patient education given to patient -Discussed importance of 150 minutes of physical activity weekly, eat two servings of fish weekly, eat one serving of tree nuts ( cashews, pistachios, pecans, almonds.[employer]) every other day, eat 6 servings of fruit/vegetables daily and drink plenty of water and avoid sweet beverages.   -Reviewed Health Maintenance: Yes.

## 2023-01-15 NOTE — Patient Instructions (Signed)
Preventive Care 52-52 Years Old, Female Preventive care refers to lifestyle choices and visits with your health care provider that can promote health and wellness. Preventive care visits are also called wellness exams. What can I expect for my preventive care visit? Counseling Your health care provider may ask you questions about your: Medical history, including: Past medical problems. Family medical history. Pregnancy history. Current health, including: Menstrual cycle. Method of birth control. Emotional well-being. Home life and relationship well-being. Sexual activity and sexual health. Lifestyle, including: Alcohol, nicotine or tobacco, and drug use. Access to firearms. Diet, exercise, and sleep habits. Work and work environment. Sunscreen use. Safety issues such as seatbelt and bike helmet use. Physical exam Your health care provider will check your: Height and weight. These may be used to calculate your BMI (body mass index). BMI is a measurement that tells if you are at a healthy weight. Waist circumference. This measures the distance around your waistline. This measurement also tells if you are at a healthy weight and may help predict your risk of certain diseases, such as type 2 diabetes and high blood pressure. Heart rate and blood pressure. Body temperature. Skin for abnormal spots. What immunizations do I need?  Vaccines are usually given at various ages, according to a schedule. Your health care provider will recommend vaccines for you based on your age, medical history, and lifestyle or other factors, such as travel or where you work. What tests do I need? Screening Your health care provider may recommend screening tests for certain conditions. This may include: Lipid and cholesterol levels. Diabetes screening. This is done by checking your blood sugar (glucose) after you have not eaten for a while (fasting). Pelvic exam and Pap test. Hepatitis B test. Hepatitis C  test. HIV (human immunodeficiency virus) test. STI (sexually transmitted infection) testing, if you are at risk. Lung cancer screening. Colorectal cancer screening. Mammogram. Talk with your health care provider about when you should start having regular mammograms. This may depend on whether you have a family history of breast cancer. BRCA-related cancer screening. This may be done if you have a family history of breast, ovarian, tubal, or peritoneal cancers. Bone density scan. This is done to screen for osteoporosis. Talk with your health care provider about your test results, treatment options, and if necessary, the need for more tests. Follow these instructions at home: Eating and drinking  Eat a diet that includes fresh fruits and vegetables, whole grains, lean protein, and low-fat dairy products. Take vitamin and mineral supplements as recommended by your health care provider. Do not drink alcohol if: Your health care provider tells you not to drink. You are pregnant, may be pregnant, or are planning to become pregnant. If you drink alcohol: Limit how much you have to 0-1 drink a day. Know how much alcohol is in your drink. In the [place] one drink equals one 12 oz bottle of beer (355 mL), one 5 oz glass of wine (148 mL), or one 1 oz glass of hard liquor (44 mL). Lifestyle Brush your teeth every morning and night with fluoride toothpaste. Floss one time each day. Exercise for at least 30 minutes 5 or more days each week. Do not use any products that contain nicotine or tobacco. These products include cigarettes, chewing tobacco, and vaping devices, such as e-cigarettes. If you need help quitting, ask your health care provider. Do not use drugs. If you are sexually active, practice safe sex. Use a condom or other form of protection to   prevent STIs. If you do not wish to become pregnant, use a form of birth control. If you plan to become pregnant, see your health care provider for a  prepregnancy visit. Take aspirin only as told by your health care provider. Make sure that you understand how much to take and what form to take. Work with your health care provider to find out whether it is safe and beneficial for you to take aspirin daily. Find healthy ways to manage stress, such as: Meditation, yoga, or listening to music. Journaling. Talking to a trusted person. Spending time with friends and family. Minimize exposure to UV radiation to reduce your risk of skin cancer. Safety Always wear your seat belt while driving or riding in a vehicle. Do not drive: If you have been drinking alcohol. Do not ride with someone who has been drinking. When you are tired or distracted. While texting. If you have been using any mind-altering substances or drugs. Wear a helmet and other protective equipment during sports activities. If you have firearms in your house, make sure you follow all gun safety procedures. Seek help if you have been physically or sexually abused. What's next? Visit your health care provider once a year for an annual wellness visit. Ask your health care provider how often you should have your eyes and teeth checked. Stay up to date on all vaccines. This information is not intended to replace advice given to you by your health care provider. Make sure you discuss any questions you have with your health care provider. Document Revised: 04/18/2021 Document Reviewed: 04/18/2021 Elsevier Patient Education  2023 Elsevier Inc.  

## 2023-01-16 ENCOUNTER — Encounter: Payer: Self-pay | Admitting: Family Medicine

## 2023-01-16 ENCOUNTER — Ambulatory Visit (INDEPENDENT_AMBULATORY_CARE_PROVIDER_SITE_OTHER): Payer: BC Managed Care – PPO | Admitting: Family Medicine

## 2023-01-16 VITALS — BP 118/70 | HR 90 | Temp 97.8°F | Resp 16 | Ht 62.0 in | Wt 175.7 lb

## 2023-01-16 DIAGNOSIS — Z23 Encounter for immunization: Secondary | ICD-10-CM

## 2023-01-16 DIAGNOSIS — Z79899 Other long term (current) drug therapy: Secondary | ICD-10-CM

## 2023-01-16 DIAGNOSIS — Z13220 Encounter for screening for lipoid disorders: Secondary | ICD-10-CM

## 2023-01-16 DIAGNOSIS — Z0000 Encounter for general adult medical examination without abnormal findings: Secondary | ICD-10-CM

## 2023-01-16 DIAGNOSIS — Z8349 Family history of other endocrine, nutritional and metabolic diseases: Secondary | ICD-10-CM | POA: Diagnosis not present

## 2023-01-16 DIAGNOSIS — Z131 Encounter for screening for diabetes mellitus: Secondary | ICD-10-CM

## 2023-01-16 DIAGNOSIS — Z1231 Encounter for screening mammogram for malignant neoplasm of breast: Secondary | ICD-10-CM

## 2023-01-16 MED ORDER — SHINGRIX 50 MCG/0.5ML IM SUSR: 0.5000 mL | Freq: Once | INTRAMUSCULAR | 0 refills | Status: AC

## 2023-01-17 LAB — COMPLETE METABOLIC PANEL WITH GFR
AG Ratio: 1.7 (calc) (ref 1.0–2.5)
ALT: 18 U/L (ref 6–29)
AST: 16 U/L (ref 10–35)
Albumin: 4.3 g/dL (ref 3.6–5.1)
Alkaline phosphatase (APISO): 81 U/L (ref 37–153)
BUN: 10 mg/dL (ref 7–25)
CO2: 28 mmol/L (ref 20–32)
Calcium: 9.3 mg/dL (ref 8.6–10.4)
Chloride: 107 mmol/L (ref 98–110)
Creat: 0.84 mg/dL (ref 0.50–1.03)
Globulin: 2.5 g/dL (calc) (ref 1.9–3.7)
Glucose, Bld: 74 mg/dL (ref 65–99)
Potassium: 4.8 mmol/L (ref 3.5–5.3)
Sodium: 142 mmol/L (ref 135–146)
Total Bilirubin: 0.4 mg/dL (ref 0.2–1.2)
Total Protein: 6.8 g/dL (ref 6.1–8.1)
eGFR: 84 mL/min/{1.73_m2} (ref >=60)

## 2023-01-17 LAB — HEMOGLOBIN A1C
Hgb A1c MFr Bld: 5.7 % of total Hgb — ABNORMAL HIGH (ref <5.7)
Mean Plasma Glucose: 117 mg/dL
eAG (mmol/L): 6.5 mmol/L

## 2023-01-17 LAB — LIPID PANEL
Cholesterol: 186 mg/dL (ref <200)
HDL: 64 mg/dL (ref >=50)
LDL Cholesterol (Calc): 106 mg/dL (calc) — ABNORMAL HIGH
Non-HDL Cholesterol (Calc): 122 mg/dL (calc) (ref <130)
Total CHOL/HDL Ratio: 2.9 (calc) (ref <5.0)
Triglycerides: 70 mg/dL (ref <150)

## 2023-01-17 LAB — TSH: TSH: 1.93 mIU/L

## 2023-01-28 ENCOUNTER — Other Ambulatory Visit: Payer: Self-pay | Admitting: Family Medicine

## 2023-02-07 ENCOUNTER — Other Ambulatory Visit: Payer: Self-pay | Admitting: Family Medicine

## 2023-02-07 DIAGNOSIS — G8929 Other chronic pain: Secondary | ICD-10-CM

## 2023-02-07 NOTE — Telephone Encounter (Signed)
Too early

## 2023-02-26 ENCOUNTER — Other Ambulatory Visit: Payer: Self-pay | Admitting: Family Medicine

## 2023-03-20 DIAGNOSIS — N390 Urinary tract infection, site not specified: Secondary | ICD-10-CM | POA: Insufficient documentation

## 2023-03-20 DIAGNOSIS — N760 Acute vaginitis: Secondary | ICD-10-CM | POA: Insufficient documentation

## 2023-03-20 DIAGNOSIS — B9689 Other specified bacterial agents as the cause of diseases classified elsewhere: Secondary | ICD-10-CM | POA: Insufficient documentation

## 2023-06-25 ENCOUNTER — Ambulatory Visit: Payer: Self-pay

## 2023-06-25 NOTE — Telephone Encounter (Signed)
     Chief Complaint: Palpitations. "Heart races. Lasts a few minutes." Apple watch shows pulse under 100. Symptoms: Above Frequency: 2-3 weeks ago after dental extraction Pertinent Negatives: Patient denies chest pain Disposition: [] ED /[] Urgent Care (no appt availability in office) / [x] Appointment(In office/virtual)/ []  [facility]/ [] Home Care/ [] Refused Recommended Disposition /[] [facility]/ []  Follow-up with PCP Additional Notes: Pt. Request appointment Friday due to work. Instructed to call 911 for worsening of symptoms.  Reason for Disposition  [1] Palpitations AND [2] no improvement after using Care Advice  Answer Assessment - Initial Assessment Questions 1. DESCRIPTION: "Please describe your heart rate or heartbeat that you are having" (e.g., fast/slow, regular/irregular, skipped or extra beats, "palpitations")     Palpitations 2. ONSET: "When did it start?" (Minutes, hours or days)      2-3 weeks ago  Sunday happened again 3. DURATION: "How long does it last" (e.g., seconds, minutes, hours)     A few minutes 4. PATTERN "Does it come and go, or has it been constant since it started?"  "Does it get worse with exertion?"   "Are you feeling it now?"     Comes and goes 5. TAP: "Using your hand, can you tap out what you are feeling on a chair or table in front of you, so that I can hear?" (Note: not all patients can do this)       No 6. HEART RATE: "Can you tell me your heart rate?" "How many beats in 15 seconds?"  (Note: not all patients can do this)       Under 100 7. RECURRENT SYMPTOM: "Have you ever had this before?" If Yes, ask: "When was the last time?" and "What happened that time?"      No 8. CAUSE: "What do you think is causing the palpitations?"     Unsure 9. CARDIAC HISTORY: "Do you have any history of heart disease?" (e.g., heart attack, angina, bypass surgery, angioplasty, arrhythmia)      No 10. OTHER SYMPTOMS: "Do you have any other  symptoms?" (e.g., dizziness, chest pain, sweating, difficulty breathing)       No 11. PREGNANCY: "Is there any chance you are pregnant?" "When was your last menstrual period?"       No  Protocols used: Heart Rate and Heartbeat Questions-A-AH

## 2023-06-26 NOTE — Progress Notes (Signed)
Name: Rita Martinez   MRN: [id number]    DOB: 10/20/1971   Date:06/27/2023       Progress Note  Subjective  Chief Complaint  Acute visit for palpitations  HPI  Palpitation: she states symptoms started after a dental procedure three weeks ago. She states initially she thought secondary to taking Tylenol , she stopped taking Tylenol and symptoms improved but over the past week having palpitations daily, multiple times a day, wakes up during the night with symptoms. Lasts 5-10 minutes , not associated with chest pain, diaphoresis or SOB. She had a few episodes of nausea with palpitation .  No other changes in medications, supplements except for the tylenol after dental procedure.   She has been feeling drained at the end of the day over the past few weeks.   Mother has thyroid disease , had thyroid ablation and now has hypothyroidism   Patient Active Problem List   Diagnosis Date Noted   Bacterial vaginosis 03/20/2023   Urinary tract infectious disease 03/20/2023   Vaginitis 03/20/2023   Pelvic pain in female 10/08/2021   Primary osteoarthritis of both knees 02/15/2021   Psoriasis of scalp 10/22/2018   Chest pain 09/24/2018   Class 1 obesity due to excess calories without serious comorbidity with body mass index (BMI) of 31.0 to 31.9 in adult 09/24/2018   Rheumatoid arthritis (HCC) 11/06/2011   Gastro-esophageal reflux disease without esophagitis 02/14/2011   Anxiety state 08/26/2007   Vitamin D deficiency 08/26/2007    Past Surgical History:  Procedure Laterality Date   AUGMENTATION MAMMAPLASTY Bilateral 1990's   saline   BREAST ENHANCEMENT SURGERY     BREAST LUMPECTOMY Left 1990's   benign   COLONOSCOPY WITH PROPOFOL N/A 02/23/2021   Procedure: COLONOSCOPY WITH PROPOFOL;  Surgeon: Wyline Mood, MD;  Location: [facility] ENDOSCOPY;  Service: Gastroenterology;  Laterality: N/A;   TONSILLECTOMY      Family History  Problem Relation Age of Onset   Breast cancer Mother 35   High  Cholesterol Mother    Multiple myeloma Mother    Prostate cancer Father    High Cholesterol Father    Hypertension Father    Asthma Daughter     Social History   Tobacco Use   Smoking status: Never   Smokeless tobacco: Never  Substance Use Topics   Alcohol use: Never     Current Outpatient Medications:    celecoxib (CELEBREX) 100 MG capsule, Take 1 capsule (100 mg total) by mouth 2 (two) times daily., Disp: 180 capsule, Rfl: 0   esomeprazole (NEXIUM) 40 MG capsule, TAKE 1 CAPSULE (40 MG TOTAL) BY MOUTH DAILY., Disp: 90 capsule, Rfl: 1   estradiol (ESTRACE) 0.1 MG/GM vaginal cream, Place vaginally., Disp: , Rfl:    PARoxetine (PAXIL) 10 MG tablet, Take 20 mg by mouth daily., Disp: , Rfl:    HYRIMOZ 40 MG/0.4ML SOAJ, Inject into the skin. (Patient not taking: Reported on 06/27/2023), Disp: , Rfl:   No Known Allergies  I personally reviewed active problem list, medication list, allergies, family history with the patient/caregiver today.   ROS  Ten systems reviewed and is negative except as mentioned in HPI    Objective  Vitals:   06/27/23 0853  BP: 122/68  Pulse: 79  Resp: 16  Temp: 98 F (36.7 C)  TempSrc: Oral  SpO2: 98%  Weight: 174 lb 8 oz (79.2 kg)  Height: 5\' 2"  (1.575 m)    Body mass index is 31.92 kg/m.  Physical Exam  Constitutional: Patient appears well-developed and well-nourished. Obese  No distress.  HEENT: head atraumatic, normocephalic, pupils equal and reactive to light, neck supple Cardiovascular: Normal rate, regular rhythm and normal heart sounds.  No murmur heard. No BLE edema. Pulmonary/Chest: Effort normal and breath sounds normal. No respiratory distress. Abdominal: Soft.  There is no tenderness. Psychiatric: Patient has a normal mood and affect. behavior is normal. Judgment and thought content normal.   PHQ2/9:    06/27/2023    9:26 AM 01/16/2023    8:37 AM 11/12/2022   10:33 AM 01/02/2022    3:41 PM 10/08/2021   11:05 AM  Depression  screen PHQ 2/9  Decreased Interest 0 0 0 0 0  Down, Depressed, Hopeless 0 0 0 0 0  PHQ - 2 Score 0 0 0 0 0  Altered sleeping 0 0 3 0 0  Tired, decreased energy 0 0 0 0 0  Change in appetite 0 0 0 0 0  Feeling bad or failure about yourself  0 0 0 0 0  Trouble concentrating 0 0 0 0 0  Moving slowly or fidgety/restless 0 0 0 0 0  Suicidal thoughts 0 0 0 0 0  PHQ-9 Score 0 0 3 0 0  Difficult doing work/chores    Not difficult at all Not difficult at all    phq 9 is negative   Fall Risk:    06/27/2023    9:25 AM 01/16/2023    8:38 AM 11/12/2022   10:33 AM 01/02/2022    3:41 PM 10/08/2021   11:05 AM  Fall Risk   Falls in the past year? 0 0 0 0 1  Number falls in past yr:   0 0 1  Injury with Fall?   0 0   Risk for fall due to : No Fall Risks No Fall Risks No Fall Risks No Fall Risks History of fall(s)  Follow up Falls prevention discussed Falls prevention discussed;Education provided;Falls evaluation completed Falls prevention discussed Falls prevention discussed Falls evaluation completed    Functional Status Survey: Is the patient deaf or have difficulty hearing?: No Does the patient have difficulty seeing, even when wearing glasses/contacts?: No Does the patient have difficulty concentrating, remembering, or making decisions?: No Does the patient have difficulty walking or climbing stairs?: Yes Does the patient have difficulty dressing or bathing?: No Does the patient have difficulty doing errands alone such as visiting a doctor's office or shopping?: No    Assessment & Plan  1. Palpitations  - EKG 12-Lead - TSH + free T4 - CBC with Differential/Platelet - COMPLETE METABOLIC PANEL WITH GFR - LONG TERM MONITOR (3-14 DAYS); Future

## 2023-06-27 ENCOUNTER — Encounter: Payer: Self-pay | Admitting: Family Medicine

## 2023-06-27 ENCOUNTER — Ambulatory Visit: Payer: BC Managed Care – PPO | Attending: Family Medicine

## 2023-06-27 ENCOUNTER — Ambulatory Visit: Payer: BC Managed Care – PPO | Admitting: Family Medicine

## 2023-06-27 VITALS — BP 122/68 | HR 79 | Temp 98.0°F | Resp 16 | Ht 62.0 in | Wt 174.5 lb

## 2023-06-27 DIAGNOSIS — R002 Palpitations: Secondary | ICD-10-CM

## 2023-06-27 NOTE — Addendum Note (Signed)
Addended by: Benay Pike on: 06/27/2023 09:59 AM   Modules accepted: Orders

## 2023-06-29 LAB — CBC WITH DIFFERENTIAL/PLATELET
Absolute Monocytes: 400 {cells}/uL (ref 200–950)
Basophils Absolute: 30 {cells}/uL (ref 0–200)
Basophils Relative: 0.6 %
Eosinophils Absolute: 90 {cells}/uL (ref 15–500)
Eosinophils Relative: 1.8 %
HCT: 39.9 % (ref 35.0–45.0)
Hemoglobin: 12.7 g/dL (ref 11.7–15.5)
Lymphs Abs: 2045 {cells}/uL (ref 850–3900)
MCH: 23.6 pg — ABNORMAL LOW (ref 27.0–33.0)
MCHC: 31.8 g/dL — ABNORMAL LOW (ref 32.0–36.0)
MCV: 74 fL — ABNORMAL LOW (ref 80.0–100.0)
MPV: 12.4 fL (ref 7.5–12.5)
Monocytes Relative: 8 %
Neutro Abs: 2435 {cells}/uL (ref 1500–7800)
Neutrophils Relative %: 48.7 %
Platelets: 221 10*3/uL (ref 140–400)
RBC: 5.39 10*6/uL — ABNORMAL HIGH (ref 3.80–5.10)
RDW: 13.8 % (ref 11.0–15.0)
Total Lymphocyte: 40.9 %
WBC: 5 10*3/uL (ref 3.8–10.8)

## 2023-06-29 LAB — COMPLETE METABOLIC PANEL WITH GFR
AG Ratio: 1.6 (calc) (ref 1.0–2.5)
ALT: 33 U/L — ABNORMAL HIGH (ref 6–29)
AST: 25 U/L (ref 10–35)
Albumin: 4.5 g/dL (ref 3.6–5.1)
Alkaline phosphatase (APISO): 94 U/L (ref 37–153)
BUN: 10 mg/dL (ref 7–25)
CO2: 22 mmol/L (ref 20–32)
Calcium: 9.7 mg/dL (ref 8.6–10.4)
Chloride: 106 mmol/L (ref 98–110)
Creat: 0.97 mg/dL (ref 0.50–1.03)
Globulin: 2.8 g/dL (ref 1.9–3.7)
Glucose, Bld: 80 mg/dL (ref 65–99)
Potassium: 4.5 mmol/L (ref 3.5–5.3)
Sodium: 145 mmol/L (ref 135–146)
Total Bilirubin: 0.3 mg/dL (ref 0.2–1.2)
Total Protein: 7.3 g/dL (ref 6.1–8.1)
eGFR: 71 mL/min/{1.73_m2} (ref >=60)

## 2023-06-29 LAB — TSH+FREE T4: TSH W/REFLEX TO FT4: 1.37 m[IU]/L

## 2023-07-09 DIAGNOSIS — R002 Palpitations: Secondary | ICD-10-CM

## 2023-08-05 ENCOUNTER — Telehealth: Payer: Self-pay

## 2023-08-05 NOTE — Telephone Encounter (Signed)
Copied from CRM [id number]. Topic: General - Inquiry >> Aug 05, 2023  2:00 PM Yolanda T wrote: Reason for CRM: patient called to have someone call her back with the results from where she wore the heart monitor for 30 days. Please f/u with patient

## 2023-08-07 ENCOUNTER — Ambulatory Visit
Admission: RE | Admit: 2023-08-07 | Discharge: 2023-08-07 | Disposition: A | Discharge status: Home / Self Care | Payer: BC Managed Care – PPO | Source: Ambulatory Visit | Attending: Family Medicine | Admitting: Family Medicine

## 2023-08-07 DIAGNOSIS — Z1231 Encounter for screening mammogram for malignant neoplasm of breast: Secondary | ICD-10-CM | POA: Insufficient documentation

## 2023-08-08 ENCOUNTER — Encounter: Payer: Self-pay | Admitting: Family Medicine

## 2023-08-11 NOTE — Telephone Encounter (Signed)
Called patient and gave results   Patient verbalized understanding

## 2024-02-27 ENCOUNTER — Other Ambulatory Visit: Payer: Self-pay | Admitting: Certified Nurse Midwife

## 2024-02-27 DIAGNOSIS — Z1231 Encounter for screening mammogram for malignant neoplasm of breast: Secondary | ICD-10-CM

## 2024-03-26 ENCOUNTER — Ambulatory Visit: Admitting: Family Medicine

## 2024-03-26 ENCOUNTER — Encounter: Payer: Self-pay | Admitting: Family Medicine

## 2024-03-26 VITALS — BP 132/78 | HR 96 | Resp 16 | Ht 63.0 in | Wt 181.3 lb

## 2024-03-26 DIAGNOSIS — Z8349 Family history of other endocrine, nutritional and metabolic diseases: Secondary | ICD-10-CM

## 2024-03-26 DIAGNOSIS — Z0001 Encounter for general adult medical examination with abnormal findings: Secondary | ICD-10-CM | POA: Diagnosis not present

## 2024-03-26 DIAGNOSIS — R7303 Prediabetes: Secondary | ICD-10-CM | POA: Diagnosis not present

## 2024-03-26 DIAGNOSIS — R5383 Other fatigue: Secondary | ICD-10-CM

## 2024-03-26 DIAGNOSIS — Z0000 Encounter for general adult medical examination without abnormal findings: Secondary | ICD-10-CM

## 2024-03-26 DIAGNOSIS — Z23 Encounter for immunization: Secondary | ICD-10-CM

## 2024-03-26 DIAGNOSIS — Z13220 Encounter for screening for lipoid disorders: Secondary | ICD-10-CM

## 2024-03-26 DIAGNOSIS — Z79899 Other long term (current) drug therapy: Secondary | ICD-10-CM

## 2024-03-26 DIAGNOSIS — Z1211 Encounter for screening for malignant neoplasm of colon: Secondary | ICD-10-CM

## 2024-03-26 NOTE — Progress Notes (Signed)
 Name: Rita Martinez   MRN: [id number]    DOB: 11/22/1970   Date:03/26/2024       Progress Note  Subjective  Chief Complaint  Chief Complaint  Patient presents with   Annual Exam    HPI  Patient presents for annual CPE.  Diet: balanced except for sweets Exercise: walking and some sit ups  - discussed increasing frequency  Last Eye Exam: completed Last Dental Exam: completed  Flowsheet Row Office Visit from 03/26/2024 in [facility]  AUDIT-C Score 0      Depression: Phq 9 is  negative    03/26/2024    1:54 PM 06/27/2023    9:26 AM 01/16/2023    8:37 AM 11/12/2022   10:33 AM 01/02/2022    3:41 PM  Depression screen PHQ 2/9  Decreased Interest 0 0 0 0 0  Down, Depressed, Hopeless 0 0 0 0 0  PHQ - 2 Score 0 0 0 0 0  Altered sleeping  0 0 3 0  Tired, decreased energy  0 0 0 0  Change in appetite  0 0 0 0  Feeling bad or failure about yourself   0 0 0 0  Trouble concentrating  0 0 0 0  Moving slowly or fidgety/restless  0 0 0 0  Suicidal thoughts  0 0 0 0  PHQ-9 Score  0 0 3 0  Difficult doing work/chores     Not difficult at all   Hypertension: BP Readings from Last 3 Encounters:  03/26/24 132/78  06/27/23 122/68  01/16/23 118/70   Obesity: Wt Readings from Last 3 Encounters:  03/26/24 181 lb 4.8 oz (82.2 kg)  06/27/23 174 lb 8 oz (79.2 kg)  01/16/23 175 lb 11.2 oz (79.7 kg)   BMI Readings from Last 3 Encounters:  03/26/24 32.12 kg/m  06/27/23 31.92 kg/m  01/16/23 32.14 kg/m     Vaccines: reviewed with the patient.   Hep C Screening: completed STD testing and prevention (HIV/chl/gon/syphilis): N/A Intimate partner violence: negative screen  Sexual History : no problems  Menstrual History/LMP/Abnormal Bleeding: s/p hysterectomy Discussed importance of follow up if any post-menopausal bleeding: not applicable  Incontinence Symptoms: positive for symptoms , stress incontinence   Breast cancer:  - Last Mammogram: schedule  -  BRCA gene screening:mother had breast cancer and multiple myeloma   Osteoporosis Prevention : Discussed high calcium and vitamin D supplementation, weight bearing exercises Bone density :discussed with patient, she will check with insurance - she will ask mother if she had genetic tested    Cervical cancer screening: not applicable due to hysterectomy  Skin cancer: Discussed monitoring for atypical lesions  Colorectal cancer: we will refer    Lung cancer:  Low Dose CT Chest recommended if Age 41-80 years, 20 pack-year currently smoking OR have quit w/in 15years. Patient does not qualify for screen   ECG: 2024   Advanced Care Planning: A voluntary discussion about advance care planning including the explanation and discussion of advance directives.  Discussed health care proxy and Living will, and the patient was able to identify a health care proxy as daughter .  Patient does not have a living will and power of attorney of health care   Patient Active Problem List   Diagnosis Date Noted   Bacterial vaginosis 03/20/2023   Urinary tract infectious disease 03/20/2023   Vaginitis 03/20/2023   Pelvic pain in female 10/08/2021   Primary osteoarthritis of both knees 02/15/2021   Psoriasis of  scalp 10/22/2018   Chest pain 09/24/2018   Class 1 obesity due to excess calories without serious comorbidity with body mass index (BMI) of 31.0 to 31.9 in adult 09/24/2018   Rheumatoid arthritis (HCC) 11/06/2011   Gastro-esophageal reflux disease without esophagitis 02/14/2011   Anxiety state 08/26/2007   Vitamin D deficiency 08/26/2007    Past Surgical History:  Procedure Laterality Date   AUGMENTATION MAMMAPLASTY Bilateral 1990's   saline   BREAST ENHANCEMENT SURGERY     BREAST LUMPECTOMY Left 1990's   benign   COLONOSCOPY WITH PROPOFOL  N/A 02/23/2021   Procedure: COLONOSCOPY WITH PROPOFOL ;  Surgeon: Luke Salaam, MD;  Location: [facility] ENDOSCOPY;  Service: Gastroenterology;  Laterality: N/A;    TONSILLECTOMY      Family History  Problem Relation Age of Onset   Breast cancer Mother 29   High Cholesterol Mother    Multiple myeloma Mother    Prostate cancer Father    High Cholesterol Father    Hypertension Father    Asthma Daughter     Social History   Socioeconomic History   Marital status: Single    Spouse name: Not on file   Number of children: 2   Years of education: Not on file   Highest education level: Not on file  Occupational History   Not on file  Tobacco Use   Smoking status: Never   Smokeless tobacco: Never  Vaping Use   Vaping status: Never Used  Substance and Sexual Activity   Alcohol use: Never   Drug use: Never   Sexual activity: Yes    Partners: Male    Birth control/protection: Surgical  Other Topics Concern   Not on file  Social History Narrative   Lives with her son Earlene Gleason Alvera Jock.    Social Drivers of Corporate investment banker Strain: Low Risk  (03/26/2024)   Overall Financial Resource Strain (CARDIA)    Difficulty of Paying Living Expenses: Not hard at all  Food Insecurity: No Food Insecurity (03/26/2024)   Hunger Vital Sign    Worried About Running Out of Food in the Last Year: Never true    Ran Out of Food in the Last Year: Never true  Transportation Needs: No Transportation Needs (03/26/2024)   PRAPARE - Administrator, Civil Service (Medical): No    Lack of Transportation (Non-Medical): No  Physical Activity: Insufficiently Active (03/26/2024)   Exercise Vital Sign    Days of Exercise per Week: 2 days    Minutes of Exercise per Session: 30 min  Stress: Stress Concern Present (03/26/2024)   [employer] of Occupational Health - Occupational Stress Questionnaire    Feeling of Stress : Very much  Social Connections: Unknown (03/26/2024)   Social Connection and Isolation Panel [NHANES]    Frequency of Communication with Friends and Family: More than three times a week    Frequency of Social Gatherings with Friends  and Family: More than three times a week    Attends Religious Services: Patient declined    Database administrator or Organizations: Patient declined    Attends Banker Meetings: Patient declined    Marital Status: Divorced  Catering manager Violence: Not At Risk (03/26/2024)   Humiliation, Afraid, Rape, and Kick questionnaire    Fear of Current or Ex-Partner: No    Emotionally Abused: No    Physically Abused: No    Sexually Abused: No     Current Outpatient Medications:    celecoxib  (CELEBREX )  100 MG capsule, Take 1 capsule (100 mg total) by mouth 2 (two) times daily., Disp: 180 capsule, Rfl: 0   esomeprazole  (NEXIUM ) 40 MG capsule, TAKE 1 CAPSULE (40 MG TOTAL) BY MOUTH DAILY., Disp: 90 capsule, Rfl: 1   estradiol (ESTRACE) 0.1 MG/GM vaginal cream, Place vaginally., Disp: , Rfl:    PARoxetine (PAXIL) 10 MG tablet, Take 20 mg by mouth daily., Disp: , Rfl:    RINVOQ 15 MG TB24, Take 1 tablet by mouth daily., Disp: , Rfl:   No Known Allergies   ROS  Constitutional: Negative for fever or weight change.  Respiratory: Negative for cough and shortness of breath.   Cardiovascular: Negative for chest pain or palpitations.  Gastrointestinal: Negative for abdominal pain, no bowel changes.  Musculoskeletal: Negative for gait problem or joint swelling.  Skin: Negative for rash.  Neurological: Negative for dizziness or headache.  No other specific complaints in a complete review of systems (except as listed in HPI above).   Objective  Vitals:   03/26/24 1358  BP: 132/78  Pulse: 96  Resp: 16  SpO2: 97%  Weight: 181 lb 4.8 oz (82.2 kg)  Height: 5\' 3"  (1.6 m)    Body mass index is 32.12 kg/m.  Physical Exam  Constitutional: Patient appears well-developed and well-nourished. No distress.  HENT: Head: Normocephalic and atraumatic. Ears: B TMs ok, no erythema or effusion; Nose: Nose normal. Mouth/Throat: Oropharynx is clear and moist. No oropharyngeal exudate.  Eyes:  Conjunctivae and EOM are normal. Pupils are equal, round, and reactive to light. No scleral icterus.  Neck: Normal range of motion. Neck supple. No JVD present. No thyromegaly present.  Cardiovascular: Normal rate, regular rhythm and normal heart sounds.  No murmur heard. No BLE edema. Pulmonary/Chest: Effort normal and breath sounds normal. No respiratory distress. Abdominal: Soft. Bowel sounds are normal, no distension. There is no tenderness. no masses Breast: lumpy and dense  no nipple discharge or rashes FEMALE GENITALIA:  Not done  RECTAL: not done  Musculoskeletal: Normal range of motion, no joint effusions. No gross deformities Neurological: he is alert and oriented to person, place, and time. No cranial nerve deficit. Coordination, balance, strength, speech and gait are normal.  Skin: Skin is warm and dry. No rash noted. No erythema.  Psychiatric: Patient has a normal mood and affect. behavior is normal. Judgment and thought content normal.     Assessment & Plan  1. Screening for colon cancer (Primary)  - Ambulatory referral to Gastroenterology  2. Well adult exam  - Ambulatory referral to Gastroenterology - Zoster Recombinant (Shingrix  ) - Lipid panel - Hemoglobin A1c - CBC with Differential/Platelet - Comprehensive metabolic panel with GFR - VITAMIN D 25 Hydroxy (Vit-D Deficiency, Fractures) - B12 and Folate Panel  3. Need for shingles vaccine  - Zoster Recombinant (Shingrix  )  4. Family history of thyroid disease  - TSH  5. Lipid screening  - Lipid panel  6. Pre-diabetes  - Hemoglobin A1c  7. Other fatigue  - CBC with Differential/Platelet - VITAMIN D 25 Hydroxy (Vit-D Deficiency, Fractures) - B12 and Folate Panel  8. Long-term use of high-risk medication  - CBC with Differential/Platelet - Comprehensive metabolic panel with GFR   -USPSTF grade A and B recommendations reviewed with patient; age-appropriate recommendations, preventive care,  screening tests, etc discussed and encouraged; healthy living encouraged; see AVS for patient education given to patient -Discussed importance of 150 minutes of physical activity weekly, eat two servings of fish weekly, eat one serving  of tree nuts ( cashews, pistachios, pecans, almonds.Aaron Aas) every other day, eat 6 servings of fruit/vegetables daily and drink plenty of water and avoid sweet beverages.   -Reviewed Health Maintenance: Yes.

## 2024-03-27 LAB — CBC WITH DIFFERENTIAL/PLATELET
Absolute Lymphocytes: 2379 {cells}/uL (ref 850–3900)
Absolute Monocytes: 481 {cells}/uL (ref 200–950)
Basophils Absolute: 33 {cells}/uL (ref 0–200)
Basophils Relative: 0.5 %
Eosinophils Absolute: 33 {cells}/uL (ref 15–500)
Eosinophils Relative: 0.5 %
HCT: 38.5 % (ref 35.0–45.0)
Hemoglobin: 12.2 g/dL (ref 11.7–15.5)
MCH: 24.1 pg — ABNORMAL LOW (ref 27.0–33.0)
MCHC: 31.7 g/dL — ABNORMAL LOW (ref 32.0–36.0)
MCV: 75.9 fL — ABNORMAL LOW (ref 80.0–100.0)
MPV: 12.1 fL (ref 7.5–12.5)
Monocytes Relative: 7.4 %
Neutro Abs: 3575 {cells}/uL (ref 1500–7800)
Neutrophils Relative %: 55 %
Platelets: 241 10*3/uL (ref 140–400)
RBC: 5.07 10*6/uL (ref 3.80–5.10)
RDW: 14.8 % (ref 11.0–15.0)
Total Lymphocyte: 36.6 %
WBC: 6.5 10*3/uL (ref 3.8–10.8)

## 2024-03-27 LAB — LIPID PANEL
Cholesterol: 238 mg/dL — ABNORMAL HIGH (ref <200)
HDL: 85 mg/dL (ref >=50)
LDL Cholesterol (Calc): 134 mg/dL — ABNORMAL HIGH
Non-HDL Cholesterol (Calc): 153 mg/dL — ABNORMAL HIGH (ref <130)
Total CHOL/HDL Ratio: 2.8 (calc) (ref <5.0)
Triglycerides: 88 mg/dL (ref <150)

## 2024-03-27 LAB — TSH: TSH: 1.55 m[IU]/L

## 2024-03-27 LAB — B12 AND FOLATE PANEL
Folate: 7.4 ng/mL
Vitamin B-12: 607 pg/mL (ref 200–1100)

## 2024-03-27 LAB — COMPREHENSIVE METABOLIC PANEL WITH GFR
AG Ratio: 1.7 (calc) (ref 1.0–2.5)
ALT: 44 U/L — ABNORMAL HIGH (ref 6–29)
AST: 31 U/L (ref 10–35)
Albumin: 4.6 g/dL (ref 3.6–5.1)
Alkaline phosphatase (APISO): 77 U/L (ref 37–153)
BUN: 14 mg/dL (ref 7–25)
CO2: 29 mmol/L (ref 20–32)
Calcium: 9.8 mg/dL (ref 8.6–10.4)
Chloride: 104 mmol/L (ref 98–110)
Creat: 0.92 mg/dL (ref 0.50–1.03)
Globulin: 2.7 g/dL (ref 1.9–3.7)
Glucose, Bld: 72 mg/dL (ref 65–99)
Potassium: 4.4 mmol/L (ref 3.5–5.3)
Sodium: 143 mmol/L (ref 135–146)
Total Bilirubin: 0.5 mg/dL (ref 0.2–1.2)
Total Protein: 7.3 g/dL (ref 6.1–8.1)
eGFR: 75 mL/min/{1.73_m2} (ref >=60)

## 2024-03-27 LAB — HEMOGLOBIN A1C
Hgb A1c MFr Bld: 5.7 % — ABNORMAL HIGH (ref <5.7)
Mean Plasma Glucose: 117 mg/dL
eAG (mmol/L): 6.5 mmol/L

## 2024-03-27 LAB — VITAMIN D 25 HYDROXY (VIT D DEFICIENCY, FRACTURES): Vit D, 25-Hydroxy: 14 ng/mL — ABNORMAL LOW (ref 30–100)

## 2024-03-30 ENCOUNTER — Other Ambulatory Visit: Payer: Self-pay | Admitting: Gastroenterology

## 2024-03-30 ENCOUNTER — Telehealth: Payer: Self-pay

## 2024-03-30 ENCOUNTER — Other Ambulatory Visit: Payer: Self-pay

## 2024-03-30 ENCOUNTER — Ambulatory Visit: Payer: Self-pay | Admitting: Family Medicine

## 2024-03-30 DIAGNOSIS — Z860100 Personal history of colon polyps, unspecified: Secondary | ICD-10-CM

## 2024-03-30 MED ORDER — SUTAB 1479-225-188 MG PO TABS
ORAL_TABLET | ORAL | 0 refills | Status: AC
Start: 2024-03-30 — End: 2024-04-01

## 2024-03-30 NOTE — Telephone Encounter (Signed)
 Gastroenterology Pre-Procedure Review  Request Date: 04-20-24 Requesting Physician: Dr. Ole Berkeley  PATIENT REVIEW QUESTIONS: The patient responded to the following health history questions as indicated:    1. Are you having any GI issues? no 2. Do you have a personal history of Polyps? yes (02/21/2021 Dr.Anna recommend repeat 3 year) 3. Do you have a family history of Colon Cancer or Polyps? yes (Maternal GM colon cancer) 4. Diabetes Mellitus? no 5. Joint replacements in the past 12 months?no 6. Major health problems in the past 3 months?no 7. Any artificial heart valves, MVP, or defibrillator?no    MEDICATIONS & ALLERGIES:    Patient reports the following regarding taking any anticoagulation/antiplatelet therapy:   Plavix, Coumadin, Eliquis, Xarelto, Lovenox, Pradaxa, Brilinta, or Effient? no Aspirin? no  Patient confirms/reports the following medications:  Current Outpatient Medications  Medication Sig Dispense Refill   celecoxib  (CELEBREX ) 100 MG capsule Take 1 capsule (100 mg total) by mouth 2 (two) times daily. 180 capsule 0   esomeprazole  (NEXIUM ) 40 MG capsule TAKE 1 CAPSULE (40 MG TOTAL) BY MOUTH DAILY. 90 capsule 1   estradiol (ESTRACE) 0.1 MG/GM vaginal cream Place vaginally.     PARoxetine (PAXIL) 10 MG tablet Take 20 mg by mouth daily.     RINVOQ 15 MG TB24 Take 1 tablet by mouth daily.     No current facility-administered medications for this visit.    Patient confirms/reports the following allergies:  No Known Allergies  No orders of the defined types were placed in this encounter.   AUTHORIZATION INFORMATION Primary Insurance: 1D#: Group #:  Secondary Insurance: 1D#: Group #:  SCHEDULE INFORMATION: Date: 04-20-2024 Time: Location: [facility]

## 2024-03-30 NOTE — Telephone Encounter (Signed)
 Spoke with patient to schedule 3 year colonoscopy- scheduled 04-19-24 with Sutab (patient requested). Instructions mailed to patient .

## 2024-03-31 ENCOUNTER — Telehealth: Payer: Self-pay

## 2024-03-31 MED ORDER — NA SULFATE-K SULFATE-MG SULF 17.5-3.13-1.6 GM/177ML PO SOLN: 1.0000 | Freq: Once | ORAL | 0 refills | Status: AC

## 2024-03-31 NOTE — Telephone Encounter (Signed)
 Received from pharmacy- sutab not covered- suprep sent to pharmacy. Left message for patient.

## 2024-04-13 ENCOUNTER — Encounter: Payer: Self-pay | Admitting: Gastroenterology

## 2024-04-20 ENCOUNTER — Encounter
Admission: RE | Disposition: A | Discharge status: Home / Self Care | Payer: Self-pay | Source: Ambulatory Visit | Attending: Gastroenterology

## 2024-04-20 ENCOUNTER — Ambulatory Visit: Admitting: Anesthesiology

## 2024-04-20 ENCOUNTER — Ambulatory Visit
Admission: RE | Admit: 2024-04-20 | Discharge: 2024-04-20 | Disposition: A | Discharge status: Home / Self Care | Source: Ambulatory Visit | Attending: Gastroenterology | Admitting: Gastroenterology

## 2024-04-20 ENCOUNTER — Encounter: Payer: Self-pay | Admitting: Gastroenterology

## 2024-04-20 DIAGNOSIS — Z1211 Encounter for screening for malignant neoplasm of colon: Secondary | ICD-10-CM | POA: Diagnosis present

## 2024-04-20 DIAGNOSIS — K640 First degree hemorrhoids: Secondary | ICD-10-CM | POA: Insufficient documentation

## 2024-04-20 DIAGNOSIS — Z860100 Personal history of colon polyps, unspecified: Secondary | ICD-10-CM

## 2024-04-20 HISTORY — PX: COLONOSCOPY: SHX5424

## 2024-04-20 SURGERY — COLONOSCOPY
Anesthesia: General

## 2024-04-20 MED ORDER — PROPOFOL 10 MG/ML IV BOLUS
INTRAVENOUS | Status: DC | PRN
  Administered 2024-04-20: 20 mg via INTRAVENOUS
  Administered 2024-04-20 (×2): 50 mg via INTRAVENOUS
  Administered 2024-04-20: 80 mg via INTRAVENOUS

## 2024-04-20 MED ORDER — SODIUM CHLORIDE 0.9 % IV SOLN
INTRAVENOUS | Status: DC
  Administered 2024-04-20: 500 mL via INTRAVENOUS

## 2024-04-20 MED ORDER — PROPOFOL 500 MG/50ML IV EMUL
INTRAVENOUS | Status: DC | PRN
  Administered 2024-04-20: 100 ug/kg/min via INTRAVENOUS

## 2024-04-20 MED ORDER — PROPOFOL 1000 MG/100ML IV EMUL
INTRAVENOUS | Status: AC
Start: 2024-04-20 — End: 2024-04-20
  Filled 2024-04-20: qty 100

## 2024-04-20 MED ORDER — DEXMEDETOMIDINE HCL IN NACL 80 MCG/20ML IV SOLN
INTRAVENOUS | Status: AC
Start: 2024-04-20 — End: 2024-04-20
  Filled 2024-04-20: qty 20

## 2024-04-20 MED ORDER — LIDOCAINE HCL (CARDIAC) PF 100 MG/5ML IV SOSY
PREFILLED_SYRINGE | INTRAVENOUS | Status: DC | PRN
Start: 2024-04-20 — End: 2024-04-20
  Administered 2024-04-20: 50 mg via INTRAVENOUS

## 2024-04-20 NOTE — H&P (Signed)
 Rita Sink, MD [facility] [address], [address] [place], [place] [address] Phone:[phone] Fax : [phone]  Primary Care Physician:  Sowles, Krichna, MD Primary Gastroenterologist:  Dr. Ole Berkeley  Pre-Procedure History & Physical: HPI:  Rita Martinez is a 53 y.o. female is here for an colonoscopy.   Past Medical History:  Diagnosis Date   GERD (gastroesophageal reflux disease)     Past Surgical History:  Procedure Laterality Date   AUGMENTATION MAMMAPLASTY Bilateral 1990's   saline   BREAST ENHANCEMENT SURGERY     BREAST LUMPECTOMY Left 1990's   benign   COLONOSCOPY WITH PROPOFOL  N/A 02/23/2021   Procedure: COLONOSCOPY WITH PROPOFOL ;  Surgeon: Luke Salaam, MD;  Location: [facility] ENDOSCOPY;  Service: Gastroenterology;  Laterality: N/A;   TONSILLECTOMY      Prior to Admission medications   Medication Sig Start Date End Date Taking? Authorizing Provider  celecoxib  (CELEBREX ) 100 MG capsule Take 1 capsule (100 mg total) by mouth 2 (two) times daily. 11/12/22  Yes Sowles, Krichna, MD  esomeprazole  (NEXIUM ) 40 MG capsule TAKE 1 CAPSULE (40 MG TOTAL) BY MOUTH DAILY. 07/09/21  Yes Sowles, Krichna, MD  estradiol (ESTRACE) 0.1 MG/GM vaginal cream Place vaginally. 03/30/23  Yes [provider]  PARoxetine (PAXIL) 10 MG tablet Take 20 mg by mouth daily. 06/12/23  Yes [provider]  RINVOQ 15 MG TB24 Take 1 tablet by mouth daily.   Yes [provider]    Allergies as of 03/30/2024   (No Known Allergies)    Family History  Problem Relation Age of Onset   Breast cancer Mother 75   High Cholesterol Mother    Multiple myeloma Mother    Prostate cancer Father    High Cholesterol Father    Hypertension Father    Asthma Daughter     Social History   Socioeconomic History   Marital status: Single    Spouse name: Not on file   Number of children: 2   Years of education: Not on file   Highest education level: Not on file  Occupational History   Not on file   Tobacco Use   Smoking status: Never   Smokeless tobacco: Never  Vaping Use   Vaping status: Never Used  Substance and Sexual Activity   Alcohol use: Never   Drug use: Never   Sexual activity: Yes    Partners: Male    Birth control/protection: Surgical  Other Topics Concern   Not on file  Social History Narrative   Lives with her son Rita Martinez Alvera Jock.    Social Drivers of Corporate investment banker Strain: Low Risk  (03/26/2024)   Overall Financial Resource Strain (CARDIA)    Difficulty of Paying Living Expenses: Not hard at all  Food Insecurity: No Food Insecurity (03/26/2024)   Hunger Vital Sign    Worried About Running Out of Food in the Last Year: Never true    Ran Out of Food in the Last Year: Never true  Transportation Needs: No Transportation Needs (03/26/2024)   PRAPARE - Administrator, Civil Service (Medical): No    Lack of Transportation (Non-Medical): No  Physical Activity: Insufficiently Active (03/26/2024)   Exercise Vital Sign    Days of Exercise per Week: 2 days    Minutes of Exercise per Session: 30 min  Stress: Stress Concern Present (03/26/2024)   [employer] of Occupational Health - Occupational Stress Questionnaire    Feeling of Stress : Very much  Social Connections:  Unknown (03/26/2024)   Social Connection and Isolation Panel    Frequency of Communication with Friends and Family: More than three times a week    Frequency of Social Gatherings with Friends and Family: More than three times a week    Attends Religious Services: Patient declined    Database administrator or Organizations: Patient declined    Attends Banker Meetings: Patient declined    Marital Status: Divorced  Catering manager Violence: Not At Risk (03/26/2024)   Humiliation, Afraid, Rape, and Kick questionnaire    Fear of Current or Ex-Partner: No    Emotionally Abused: No    Physically Abused: No    Sexually Abused: No    Review of Systems: See HPI,  otherwise negative ROS  Physical Exam: BP 131/86   Pulse 83   Temp (!) 96.5 F (35.8 C) (Temporal)   Resp 16   Ht 5' 2 (1.575 m)   Wt 82 kg   SpO2 98%   BMI 33.07 kg/m  General:   Alert,  pleasant and cooperative in NAD Head:  Normocephalic and atraumatic. Neck:  Supple; no masses or thyromegaly. Lungs:  Clear throughout to auscultation.    Heart:  Regular rate and rhythm. Abdomen:  Soft, nontender and nondistended. Normal bowel sounds, without guarding, and without rebound.   Neurologic:  Alert and  oriented x4;  grossly normal neurologically.  Impression/Plan: Rita Martinez is here for an colonoscopy to be performed for a history of adenomatous polyps on 2022    Risks, benefits, limitations, and alternatives regarding  colonoscopy have been reviewed with the patient.  Questions have been answered.  All parties agreeable.   Rita Sink, MD  04/20/2024, 7:24 AM

## 2024-04-20 NOTE — Anesthesia Postprocedure Evaluation (Signed)
 Anesthesia Post Note  Patient: Rita Martinez  Procedure(s) Performed: COLONOSCOPY  Patient location during evaluation: Endoscopy Anesthesia Type: General Level of consciousness: awake and alert Pain management: pain level controlled Vital Signs Assessment: post-procedure vital signs reviewed and stable Respiratory status: spontaneous breathing, nonlabored ventilation, respiratory function stable and patient connected to nasal cannula oxygen Cardiovascular status: blood pressure returned to baseline and stable Postop Assessment: no apparent nausea or vomiting Anesthetic complications: no  No notable events documented.   Last Vitals:  Vitals:   04/20/24 0809 04/20/24 0819  BP: 99/67 109/67  Pulse: 73 (!) 57  Resp: (!) 22 14  Temp:    SpO2: 100% 100%    Last Pain:  Vitals:   04/20/24 0819  TempSrc:   PainSc: 0-No pain                 Lattie Poli

## 2024-04-20 NOTE — Op Note (Signed)
 [facility] Gastroenterology Patient Name: Rita Martinez Procedure Date: 04/20/2024 7:37 AM MRN: [id number] Account #: [id number] Date of Birth: 25-Feb-1971 Admit Type: Outpatient Age: 53 Room: [facility] ENDO ROOM 4 Gender: Female Note Status: Finalized Instrument Name: Charlyn Cooley [id number] Procedure:             Colonoscopy Indications:           High risk colon cancer surveillance: Personal history                         of colonic polyps Providers:             Marnee Sink MD, MD Referring MD:          Lavonna Prader. Sowles, MD (Referring MD) Medicines:             Propofol  per Anesthesia Complications:         No immediate complications. Procedure:             Pre-Anesthesia Assessment:                        - Prior to the procedure, a History and Physical was                         performed, and patient medications and allergies were                         reviewed. The patient's tolerance of previous                         anesthesia was also reviewed. The risks and benefits                         of the procedure and the sedation options and risks                         were discussed with the patient. All questions were                         answered, and informed consent was obtained. Prior                         Anticoagulants: The patient has taken no anticoagulant                         or antiplatelet agents. ASA Grade Assessment: II - A                         patient with mild systemic disease. After reviewing                         the risks and benefits, the patient was deemed in                         satisfactory condition to undergo the procedure.                        After obtaining informed consent, the colonoscope was  passed under direct vision. Throughout the procedure,                         the patient's blood pressure, pulse, and oxygen                         saturations were monitored continuously. The                          Colonoscope was introduced through the anus and                         advanced to the the cecum, identified by appendiceal                         orifice and ileocecal valve. The colonoscopy was                         performed without difficulty. The patient tolerated                         the procedure well. The quality of the bowel                         preparation was excellent. Findings:      The perianal and digital rectal examinations were normal.      Non-bleeding internal hemorrhoids were found during retroflexion. The       hemorrhoids were Grade I (internal hemorrhoids that do not prolapse).      The exam was otherwise without abnormality. Impression:            - Non-bleeding internal hemorrhoids.                        - The examination was otherwise normal.                        - No specimens collected. Recommendation:        - Discharge patient to home.                        - Resume previous diet.                        - Continue present medications.                        - Repeat colonoscopy in 7 years for surveillance. Procedure Code(s):     --- Professional ---                        [phone], Colonoscopy, flexible; diagnostic, including                         collection of specimen(s) by brushing or washing, when                         performed (separate procedure) Diagnosis Code(s):     --- Professional ---  Z86.010, Personal history of colonic polyps CPT copyright 2022 [employer]. All rights reserved. The codes documented in this report are preliminary and upon coder review may  be revised to meet current compliance requirements. Marnee Sink MD, MD 04/20/2024 7:53:37 AM This report has been signed electronically. Number of Addenda: 0 Note Initiated On: 04/20/2024 7:37 AM Scope Withdrawal Time: 0 hours 6 minutes 40 seconds  Total Procedure Duration: 0 hours 10 minutes 27 seconds  Estimated  Blood Loss:  Estimated blood loss: none.      [facility]

## 2024-04-20 NOTE — Transfer of Care (Signed)
 Immediate Anesthesia Transfer of Care Note  Patient: Rita Martinez  Procedure(s) Performed: COLONOSCOPY  Patient Location: PACU  Anesthesia Type:MAC  Level of Consciousness: awake  Airway & Oxygen Therapy: Patient Spontanous Breathing  Post-op Assessment: Report given to RN and Post -op Vital signs reviewed and stable  Post vital signs: Reviewed and stable  Last Vitals:  Vitals Value Taken Time  BP 102/60 04/20/24 07:59  Temp 36.1 C 04/20/24 07:59  Pulse 84 04/20/24 07:59  Resp 17 04/20/24 07:59  SpO2 99 % 04/20/24 07:59    Last Pain:  Vitals:   04/20/24 0759  TempSrc: Temporal  PainSc: Asleep         Complications: No notable events documented.

## 2024-04-20 NOTE — Anesthesia Preprocedure Evaluation (Signed)
 Anesthesia Evaluation  Patient identified by MRN, date of birth, ID band Patient awake    Reviewed: Allergy & Precautions, NPO status , Patient's Chart, lab work & pertinent test results  History of Anesthesia Complications Negative for: history of anesthetic complications  Airway Mallampati: II  TM Distance: >3 FB Neck ROM: Full    Dental no notable dental hx. (+) Teeth Intact   Pulmonary neg pulmonary ROS, neg sleep apnea, neg COPD, Patient abstained from smoking.Not current smoker   Pulmonary exam normal breath sounds clear to auscultation       Cardiovascular Exercise Tolerance: Good METS(-) hypertension(-) CAD and (-) Past MI negative cardio ROS (-) dysrhythmias  Rhythm:Regular Rate:Normal - Systolic murmurs    Neuro/Psych  PSYCHIATRIC DISORDERS Anxiety     negative neurological ROS     GI/Hepatic ,GERD  ,,(+)     (-) substance abuse    Endo/Other  neg diabetes    Renal/GU negative Renal ROS     Musculoskeletal   Abdominal  (+) + obese  Peds  Hematology   Anesthesia Other Findings Past Medical History: No date: GERD (gastroesophageal reflux disease)  Reproductive/Obstetrics                             Anesthesia Physical Anesthesia Plan  ASA: 2  Anesthesia Plan: General   Post-op Pain Management: Minimal or no pain anticipated   Induction: Intravenous  PONV Risk Score and Plan: 3 and Propofol  infusion, TIVA and Ondansetron  Airway Management Planned: Nasal Cannula  Additional Equipment: None  Intra-op Plan:   Post-operative Plan:   Informed Consent: I have reviewed the patients History and Physical, chart, labs and discussed the procedure including the risks, benefits and alternatives for the proposed anesthesia with the patient or authorized representative who has indicated his/her understanding and acceptance.     Dental advisory given  Plan Discussed with:  CRNA and Surgeon  Anesthesia Plan Comments: (Discussed risks of anesthesia with patient, including possibility of difficulty with spontaneous ventilation under anesthesia necessitating airway intervention, PONV, and rare risks such as cardiac or respiratory or neurological events, and allergic reactions. Discussed the role of CRNA in patient's perioperative care. Patient understands.)       Anesthesia Quick Evaluation

## 2024-07-30 ENCOUNTER — Ambulatory Visit: Admitting: Family Medicine

## 2024-07-30 ENCOUNTER — Encounter: Payer: Self-pay | Admitting: Family Medicine

## 2024-07-30 VITALS — BP 120/74 | HR 74 | Resp 16 | Ht 62.0 in | Wt 184.9 lb

## 2024-07-30 DIAGNOSIS — M0600 Rheumatoid arthritis without rheumatoid factor, unspecified site: Secondary | ICD-10-CM | POA: Diagnosis not present

## 2024-07-30 DIAGNOSIS — L409 Psoriasis, unspecified: Secondary | ICD-10-CM | POA: Diagnosis not present

## 2024-07-30 DIAGNOSIS — L609 Nail disorder, unspecified: Secondary | ICD-10-CM

## 2024-07-30 DIAGNOSIS — Z23 Encounter for immunization: Secondary | ICD-10-CM

## 2024-07-30 DIAGNOSIS — R232 Flushing: Secondary | ICD-10-CM | POA: Diagnosis not present

## 2024-07-30 DIAGNOSIS — E559 Vitamin D deficiency, unspecified: Secondary | ICD-10-CM

## 2024-07-30 DIAGNOSIS — R7303 Prediabetes: Secondary | ICD-10-CM

## 2024-07-30 MED ORDER — TERBINAFINE HCL 1 % EX CREA
1.0000 | TOPICAL_CREAM | Freq: Two times a day (BID) | CUTANEOUS | 0 refills | Status: AC
Start: 2024-07-30 — End: ?

## 2024-07-30 NOTE — Progress Notes (Signed)
 Name: Rita Martinez   MRN: [id number]    DOB: November 25, 1970   Date:07/30/2024       Progress Note  Subjective  Chief Complaint  Chief Complaint  Patient presents with   finger nail    R ring finger lifted off to the cutical, sore, ongoing for a while   Discussed the use of AI scribe software for clinical note transcription with the patient, who gave verbal consent to proceed.  History of Present Illness Rita Martinez is a 53 year old female with rheumatoid arthritis who presents with a fingernail issue and palpitations.  She has been experiencing a fingernail issue on the right hand's fourth finger for a couple of months. The nail is thin, detaching from the nail bed, with redness and occasional pain underneath. She has been applying an antifungal liquid without improvement. There is no history of trauma or visits to a nail salon. She covers the nail with a Band-Aid at work due to its appearance.  She experiences intermittent palpitations that last a couple of days and then resolve. She is not currently taking any medication for this issue but has had previous evaluations for palpitations.  Her current medications include Paxil for hot flashes, Rinvoq for rheumatoid arthritis, and occasional use of Celebrex . She no longer takes Nexium  regularly but uses it during flare-ups. She also uses estradiol vaginal cream for menopausal symptoms.  She has a history of rheumatoid arthritis and psoriasis affecting her scalp and ears. She takes Rinvoq daily for rheumatoid arthritis. She also has prediabetes with an A1c of 5.7, and her vitamin D  was previously low, for which she takes 1000 IU daily.    Patient Active Problem List   Diagnosis Date Noted   Hx of colonic polyps 04/20/2024   Bacterial vaginosis 03/20/2023   Urinary tract infectious disease 03/20/2023   Vaginitis 03/20/2023   Pelvic pain in female 10/08/2021   Primary osteoarthritis of both knees 02/15/2021   Psoriasis of scalp  10/22/2018   Chest pain 09/24/2018   Class 1 obesity due to excess calories without serious comorbidity with body mass index (BMI) of 31.0 to 31.9 in adult 09/24/2018   Rheumatoid arthritis (HCC) 11/06/2011   Gastro-esophageal reflux disease without esophagitis 02/14/2011   Anxiety state 08/26/2007   Vitamin D  deficiency 08/26/2007    Past Surgical History:  Procedure Laterality Date   AUGMENTATION MAMMAPLASTY Bilateral 1990's   saline   BREAST ENHANCEMENT SURGERY     BREAST LUMPECTOMY Left 1990's   benign   COLONOSCOPY N/A 04/20/2024   Procedure: COLONOSCOPY;  Surgeon: Jinny Carmine, MD;  Location: [facility] ENDOSCOPY;  Service: Endoscopy;  Laterality: N/A;   COLONOSCOPY WITH PROPOFOL  N/A 02/23/2021   Procedure: COLONOSCOPY WITH PROPOFOL ;  Surgeon: Therisa Bi, MD;  Location: [facility] ENDOSCOPY;  Service: Gastroenterology;  Laterality: N/A;   TONSILLECTOMY      Family History  Problem Relation Age of Onset   Breast cancer Mother 44   High Cholesterol Mother    Multiple myeloma Mother    Prostate cancer Father    High Cholesterol Father    Hypertension Father    Asthma Daughter     Social History   Tobacco Use   Smoking status: Never   Smokeless tobacco: Never  Substance Use Topics   Alcohol use: Never     Current Outpatient Medications:    celecoxib  (CELEBREX ) 100 MG capsule, Take 1 capsule (100 mg total) by mouth 2 (two) times daily., Disp: 180 capsule, Rfl: 0  Cholecalciferol (VITAMIN D ) 50 MCG (2000 UT) CAPS, Take 1 capsule by mouth daily., Disp: , Rfl:    estradiol (ESTRACE) 0.1 MG/GM vaginal cream, Place vaginally., Disp: , Rfl:    PARoxetine (PAXIL) 10 MG tablet, Take 20 mg by mouth daily., Disp: , Rfl:    RINVOQ 15 MG TB24, Take 1 tablet by mouth daily., Disp: , Rfl:    terbinafine  (LAMISIL  AT) 1 % cream, Apply 1 Application topically 2 (two) times daily., Disp: 30 g, Rfl: 0  No Known Allergies  I personally reviewed active problem list, medication list, allergies  with the patient/caregiver today.   ROS  Ten systems reviewed and is negative except as mentioned in HPI    Objective Physical Exam CONSTITUTIONAL: Patient appears well-developed and well-nourished. No distress. HEENT: Head atraumatic, normocephalic, neck supple. CARDIOVASCULAR: Normal rate, regular rhythm and normal heart sounds. No murmur heard. No BLE edema. PULMONARY: Effort normal and breath sounds normal. No respiratory distress. ABDOMINAL: There is no tenderness or distention. MUSCULOSKELETAL: Normal gait. Without gross motor or sensory deficit. PSYCHIATRIC: Patient has a normal mood and affect. Behavior is normal. Judgment and thought content normal. SKIN: Nail of the right hand, fourth finger is thin, almost detached, with erythema underneath.  Vitals:   07/30/24 1318  BP: 120/74  Pulse: 74  Resp: 16  SpO2: 99%  Weight: 184 lb 14.4 oz (83.9 kg)  Height: 5' 2 (1.575 m)    Body mass index is 33.82 kg/m.    PHQ2/9:    07/30/2024    1:15 PM 03/26/2024    1:54 PM 06/27/2023    9:26 AM 01/16/2023    8:37 AM 11/12/2022   10:33 AM  Depression screen PHQ 2/9  Decreased Interest 0 0 0 0 0  Down, Depressed, Hopeless 0 0 0 0 0  PHQ - 2 Score 0 0 0 0 0  Altered sleeping   0 0 3  Tired, decreased energy   0 0 0  Change in appetite   0 0 0  Feeling bad or failure about yourself    0 0 0  Trouble concentrating   0 0 0  Moving slowly or fidgety/restless   0 0 0  Suicidal thoughts   0 0 0  PHQ-9 Score   0 0 3    phq 9 is negative  Fall Risk:    07/30/2024    1:15 PM 03/26/2024    1:54 PM 06/27/2023    9:25 AM 01/16/2023    8:38 AM 11/12/2022   10:33 AM  Fall Risk   Falls in the past year? 0 0 0 0 0  Number falls in past yr: 0 0   0  Injury with Fall? 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 Possible dermatological, traumatic, psoriatic, or rheumatoid arthritis-related etiology. - Prescribed antifungal cream for application with Q-tip or toothpick, avoiding pressure on healthy nail. - Referred to dermatologist for further evaluation. - Advised to keep nail trimmed short.  Rheumatoid arthritis, multiple sites, seronegative Seronegative rheumatoid arthritis managed with  Rinvoq. Possible relation to nail dystrophy. - Continue Rinvoq as prescribed. - Discuss nail dystrophy with rheumatologist, Dr. Tobie.  Psoriasis of scalp and ears Psoriasis affecting scalp and ears, potential contributor to nail dystrophy. - Discuss with dermatologist during referral visit.  Palpitations Intermittent palpitations, previously evaluated, likely perimenopausal.  Menopausal symptoms (hot flashes) Hot flashes managed with paroxetine. HRT discussed as potential option. - Continue paroxetine as prescribed by OBGYN. - Consider HRT if symptoms worsen, discuss risks and benefits.  Prediabetes Prediabetes with A1c of 5.7%. Emphasized diet and lifestyle modifications. Increased cardiovascular risk due to rheumatoid and psoriatic arthritis, especially postmenopausal. - Advise on healthy diet and regular physical activity.  Vitamin D  deficiency Vitamin D  deficiency with current supplementation inadequate. - Increase vitamin D  supplementation to 2000 IU daily.  General Health Maintenance Routine health maintenance with emphasis on vaccinations due to increased risk factors. - Administer flu shot during visit. - Ensure all vaccinations are up to date.

## 2024-10-08 ENCOUNTER — Ambulatory Visit: Admitting: Family Medicine

## 2024-11-19 ENCOUNTER — Ambulatory Visit: Admitting: Family Medicine

## 2025-03-03 ENCOUNTER — Ambulatory Visit: Admitting: Dermatology

## 2025-04-01 ENCOUNTER — Encounter: Admitting: Family Medicine
